# Patient Record
Sex: Female | Born: 1975 | Race: Black or African American | Hispanic: No | Marital: Married | Smoking: Never smoker
Health system: Southern US, Community
[De-identification: ages and names within clinical notes are randomized; demographics above are authoritative.]

## PROBLEM LIST (undated history)

## (undated) DIAGNOSIS — D649 Anemia, unspecified: Secondary | ICD-10-CM

## (undated) DIAGNOSIS — F419 Anxiety disorder, unspecified: Secondary | ICD-10-CM

## (undated) DIAGNOSIS — R079 Chest pain, unspecified: Secondary | ICD-10-CM

## (undated) HISTORY — PX: CHOLECYSTECTOMY: SHX55

## (undated) HISTORY — PX: DILATION AND CURETTAGE OF UTERUS: SHX78

---

## 2014-08-12 DIAGNOSIS — E049 Nontoxic goiter, unspecified: Secondary | ICD-10-CM | POA: Diagnosis present

## 2020-02-01 ENCOUNTER — Encounter: Payer: Self-pay | Admitting: Emergency Medicine

## 2020-02-01 ENCOUNTER — Emergency Department: Payer: 59

## 2020-02-01 ENCOUNTER — Emergency Department
Admission: EM | Admit: 2020-02-01 | Discharge: 2020-02-01 | Disposition: A | Discharge status: Home / Self Care | Payer: 59 | Attending: Emergency Medicine | Admitting: Emergency Medicine

## 2020-02-01 ENCOUNTER — Other Ambulatory Visit: Payer: Self-pay

## 2020-02-01 DIAGNOSIS — R110 Nausea: Secondary | ICD-10-CM | POA: Insufficient documentation

## 2020-02-01 DIAGNOSIS — R002 Palpitations: Secondary | ICD-10-CM | POA: Diagnosis not present

## 2020-02-01 DIAGNOSIS — R079 Chest pain, unspecified: Secondary | ICD-10-CM | POA: Diagnosis present

## 2020-02-01 DIAGNOSIS — R42 Dizziness and giddiness: Secondary | ICD-10-CM | POA: Diagnosis not present

## 2020-02-01 HISTORY — DX: Anxiety disorder, unspecified: F41.9

## 2020-02-01 HISTORY — DX: Anemia, unspecified: D64.9

## 2020-02-01 HISTORY — DX: Chest pain, unspecified: R07.9

## 2020-02-01 LAB — CBC
HCT: 28.6 % — ABNORMAL LOW (ref 36.0–46.0)
Hemoglobin: 8.7 g/dL — ABNORMAL LOW (ref 12.0–15.0)
MCH: 21.8 pg — ABNORMAL LOW (ref 26.0–34.0)
MCHC: 30.4 g/dL (ref 30.0–36.0)
MCV: 71.5 fL — ABNORMAL LOW (ref 80.0–100.0)
Platelets: 288 10*3/uL (ref 150–400)
RBC: 4 MIL/uL (ref 3.87–5.11)
RDW: 20 % — ABNORMAL HIGH (ref 11.5–15.5)
WBC: 4.5 10*3/uL (ref 4.0–10.5)
nRBC: 0 % (ref 0.0–0.2)

## 2020-02-01 LAB — BASIC METABOLIC PANEL
Anion gap: 8 (ref 5–15)
BUN: 9 mg/dL (ref 6–20)
CO2: 23 mmol/L (ref 22–32)
Calcium: 8.4 mg/dL — ABNORMAL LOW (ref 8.9–10.3)
Chloride: 108 mmol/L (ref 98–111)
Creatinine, Ser: 0.78 mg/dL (ref 0.44–1.00)
GFR, Estimated: >60 mL/min (ref >60)
Glucose, Bld: 110 mg/dL — ABNORMAL HIGH (ref 70–99)
Potassium: 3.6 mmol/L (ref 3.5–5.1)
Sodium: 139 mmol/L (ref 135–145)

## 2020-02-01 LAB — TROPONIN I (HIGH SENSITIVITY)
Troponin I (High Sensitivity): 3 ng/L (ref <18)
Troponin I (High Sensitivity): 5 ng/L (ref <18)

## 2020-02-01 LAB — TSH: TSH: 1.672 u[IU]/mL (ref 0.350–4.500)

## 2020-02-01 LAB — POC URINE PREG, ED: Preg Test, Ur: NEGATIVE

## 2020-02-01 IMAGING — CR DG CHEST 2V
2 series · 2 of 2 positions shown · non-contrast
Comparison: None.

CLINICAL DATA: Chest pain

EXAM:
CHEST - 2 VIEW

[chest pa]
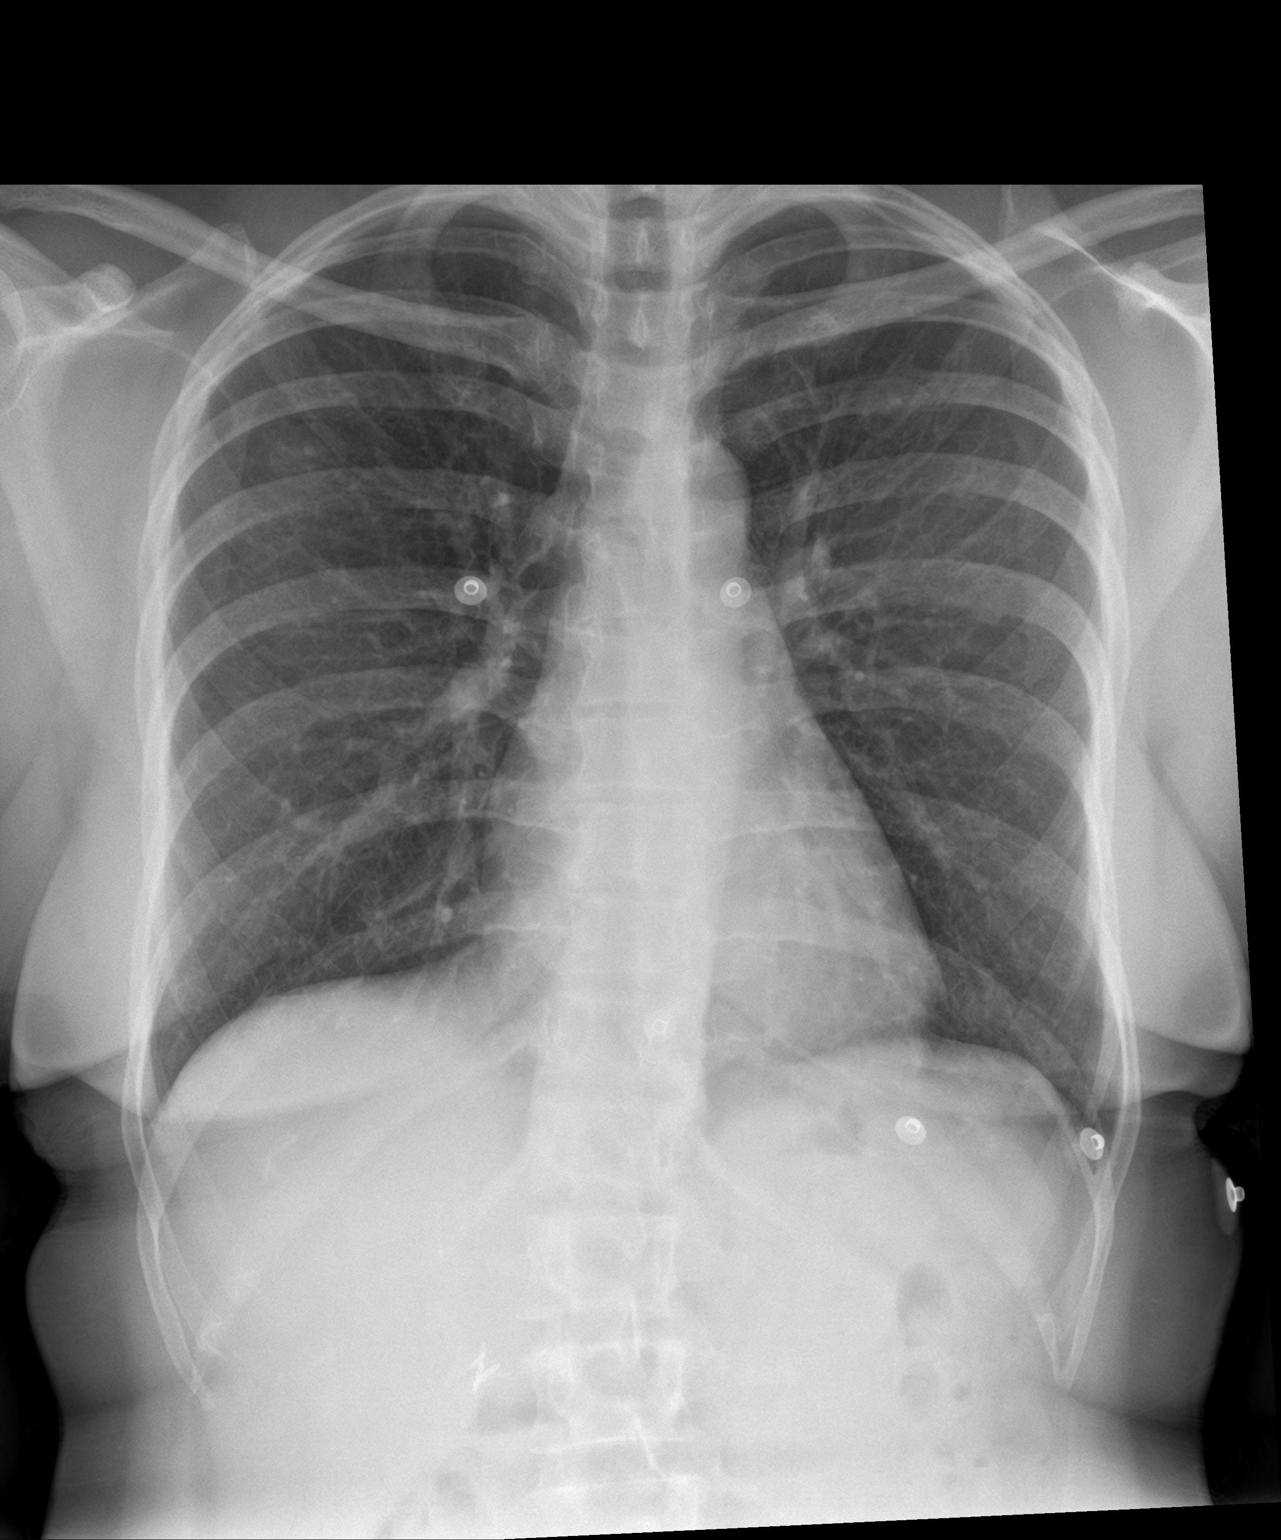

[chest lat]
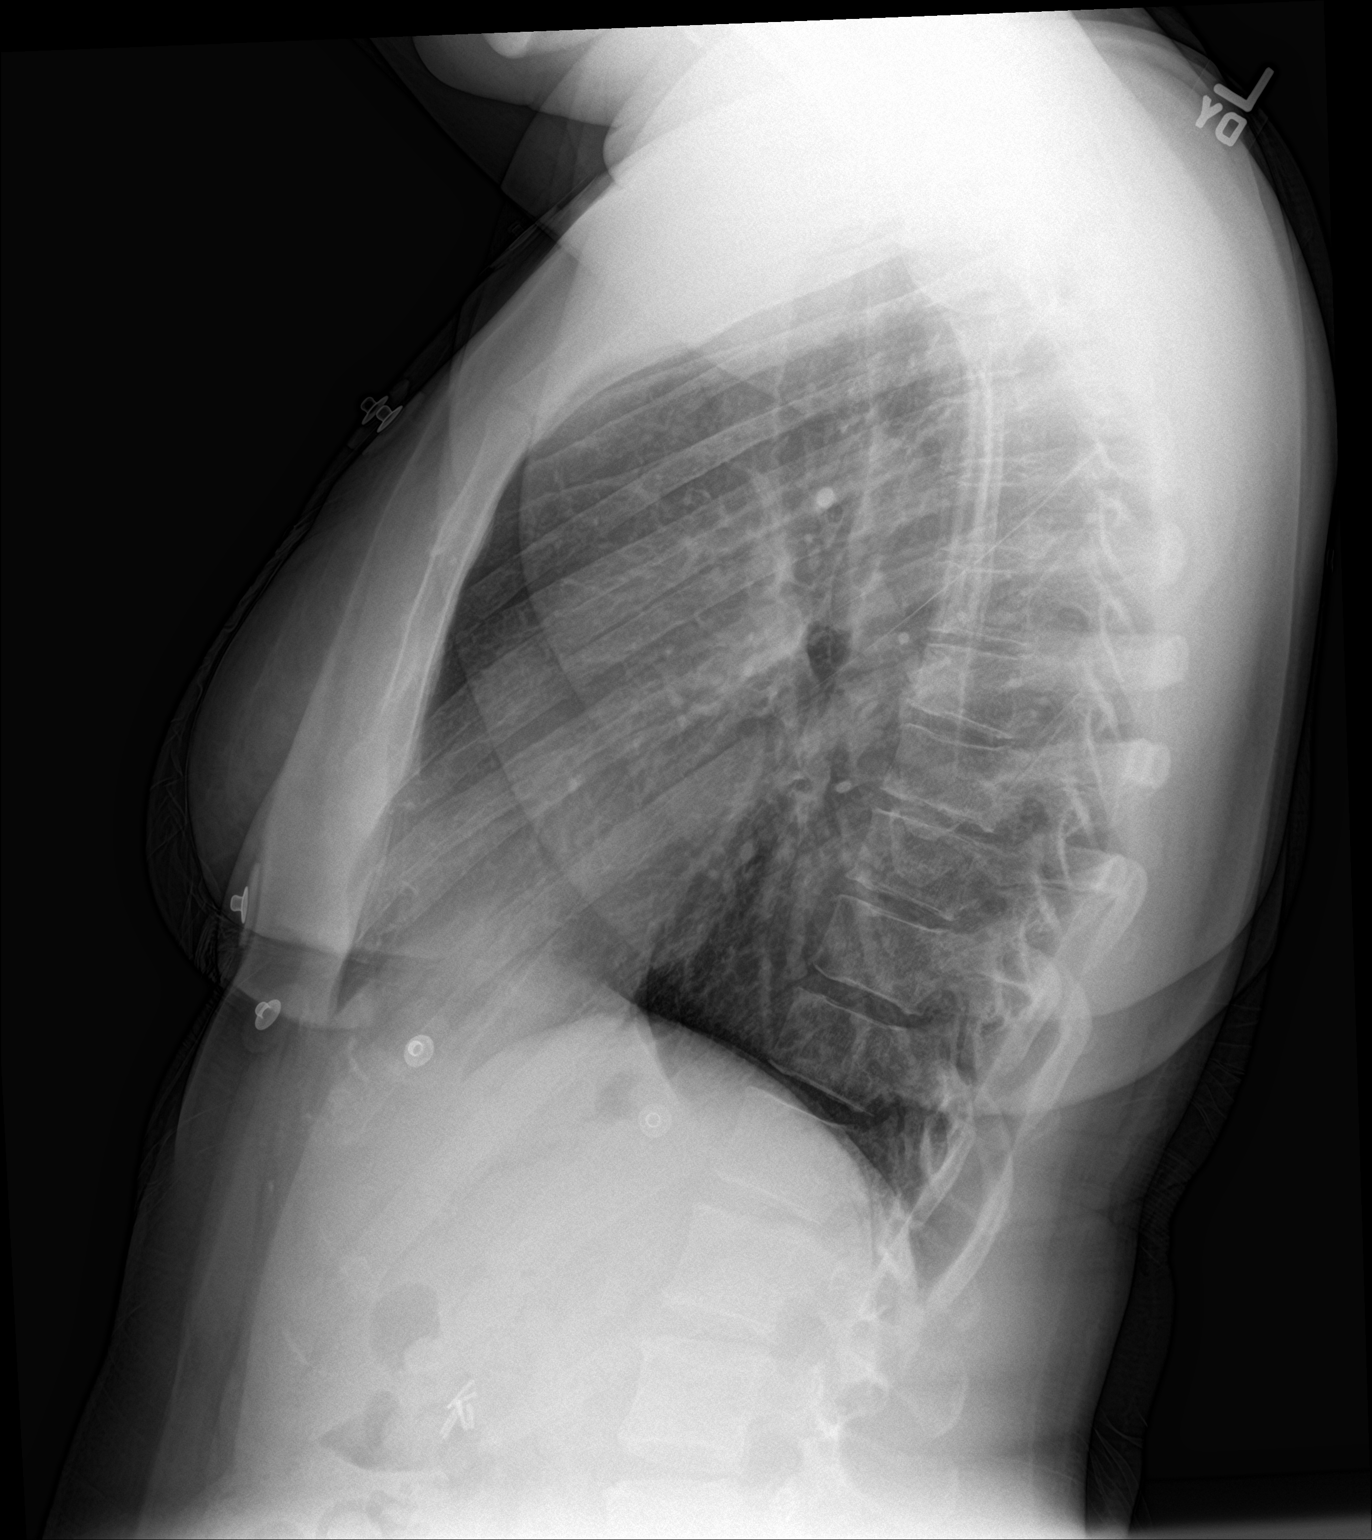

[2 of 2 positions shown; findings below may reference images not displayed]

FINDINGS: The cardiomediastinal silhouette is normal in contour. No pleural
effusion. No pneumothorax. No acute pleuroparenchymal abnormality.
Status post cholecystectomy. No acute osseous abnormality noted.
IMPRESSION: No acute cardiopulmonary abnormality.

## 2020-02-01 NOTE — ED Triage Notes (Signed)
Pt presents to ED via [facility] from home with c/o mid CP that is dull and radiates to back and L axilla x approx 1 hr PTA. Per EMS pt also c/o dizziness and nausea as well. EMS reports prior to their arrival pt took 324 ASA, 4mg  Zofran given en route, 2 SL nitro sprays given en route. EMS reports per patient HR 160 prior to arrival, per EMS HR initially 120, now 90, NSR on the monitor.

## 2020-02-01 NOTE — ED Notes (Signed)
Pt taken to Xray at this time.

## 2020-02-01 NOTE — ED Notes (Signed)
Pt returned from Xray at this time  

## 2020-02-01 NOTE — Discharge Instructions (Signed)
Please do not drink more than 1 or 2 cups of caffeinated coffee a day.  I think your rapid heartbeat and palpitations and chest pain came from that.  I would like you to follow-up with the cardiologist.  Dr. Clayborn Bigness is on-call he is very good.  I have given you his contact information.  If you call the office and let the office staff know that you were in the emergency room with rapid heartbeat and chest pain that should be out again she went fairly quickly.  Please return here for any further problems.  Your thyroid function test, TSH was normal.  You are somewhat anemic.  You might want to get some vitamins with iron and take 1 a day.  Is very common for American woman to be anemic.

## 2020-02-01 NOTE — ED Provider Notes (Signed)
[facility] Emergency Department Provider Note   ____________________________________________   First MD Initiated Contact with Patient 02/01/20 1554     (approximate)  I have reviewed the triage vital signs and the nursing notes.   HISTORY  Chief Complaint Chest Pain    HPI Brenda Savage is a 44 y.o. female who was going to the store and developed a rapid heartbeat and some chest pain radiated to the back in the axilla on the left side.  She got dizzy and nauseated.  She laid down for a while heart white rate went down to 140 and then now it is normal.  She reports she is doing the week she does not drink caffeinated coffee but on the weekend she had at least 4 cups of coffee in the last 24 hours possibly more.  She has not had a rapid heartbeat like this or chest pain like this before but she has had a diagnosis of angina in the past.   Patient does not use soft drinks or drink alcohol or use street drugs.      Past Medical History:  Diagnosis Date  . Anemia   . Anxiety   . Chest pain     There are no problems to display for this patient.     Prior to Admission medications   Not on File    Allergies Patient has no known allergies.  No family history on file.  Social History Social History   Tobacco Use  . Smoking status: Never Smoker  . Smokeless tobacco: Never Used  Substance Use Topics  . Alcohol use: Not Currently  . Drug use: Not Currently    Review of Systems Currently: Constitutional: No fever/chills Eyes: No visual changes. ENT: No sore throat. Cardiovascular: Denies chest pain. Respiratory: Denies shortness of breath. Gastrointestinal: No abdominal pain.  No nausea, no vomiting.  No diarrhea.  No constipation. Genitourinary: Negative for dysuria. Musculoskeletal: Negative for back pain. Skin: Negative for rash. Neurological: Negative for headaches, focal weakness    ____________________________________________   PHYSICAL EXAM:  VITAL SIGNS: ED Triage Vitals  Enc Vitals Group     BP 02/01/20 1412 134/85     Pulse Rate 02/01/20 1412 90     Resp 02/01/20 1412 14     Temp 02/01/20 1412 98.5 F (36.9 C)     Temp Source 02/01/20 1412 Oral     SpO2 02/01/20 1412 100 %     Weight 02/01/20 1413 166 lb (75.3 kg)     Height 02/01/20 1413 5\' 5"  (1.651 m)     Head Circumference --      Peak Flow --      Pain Score 02/01/20 1412 5     Pain Loc --      Pain Edu? --      Excl. in [place]? --     Constitutional: Alert and oriented. Well appearing and in no acute distress. Eyes: Conjunctivae are normal. PER Head: Atraumatic. Nose: No congestion/rhinnorhea. Mouth/Throat: Mucous membranes are moist.  Oropharynx non-erythematous. Neck: No stridor.   Cardiovascular: Normal rate, regular rhythm. Grossly normal heart sounds.  Good peripheral circulation. Respiratory: Normal respiratory effort.  No retractions. Lungs CTAB. Gastrointestinal: Soft and nontender. No distention. No abdominal bruits. No CVA tenderness. Musculoskeletal: No lower extremity tenderness nor edema.   Neurologic:  Normal speech and language. No gross focal neurologic deficits are appreciated. ty. Skin:  Skin is warm, dry and intact. No rash noted.   ____________________________________________  LABS (all labs ordered are listed, but only abnormal results are displayed)  Labs Reviewed  BASIC METABOLIC PANEL - Abnormal; Notable for the following components:      Result Value   Glucose, Bld 110 (*)    Calcium 8.4 (*)    All other components within normal limits  CBC - Abnormal; Notable for the following components:   Hemoglobin 8.7 (*)    HCT 28.6 (*)    MCV 71.5 (*)    MCH 21.8 (*)    RDW 20.0 (*)    All other components within normal limits  TSH  POC URINE PREG, ED  TROPONIN I (HIGH SENSITIVITY)  TROPONIN I (HIGH SENSITIVITY)    ____________________________________________  EKG  EKG read interpreted by me shows rate of 93 normal or slight right axis no acute ST-T wave changes but there are some nonspecific changes. ____________________________________________  RADIOLOGY Gertha Calkin, personally viewed and evaluated these images (plain radiographs) as part of my medical decision making, as well as reviewing the written report by the radiologist.  ED MD interpretation: Chest x-ray read interpreted by me me I have not seen the radiologist reading it shows no acute changes  Official radiology report(s): DG Chest 2 View  Result Date: 02/01/2020 CLINICAL DATA:  Chest pain EXAM: CHEST - 2 VIEW COMPARISON:  None. FINDINGS: The cardiomediastinal silhouette is normal in contour. No pleural effusion. No pneumothorax. No acute pleuroparenchymal abnormality. Status post cholecystectomy. No acute osseous abnormality noted. IMPRESSION: No acute cardiopulmonary abnormality. Electronically Signed   By: Valentino Saxon MD   On: 02/01/2020 15:16    ____________________________________________   PROCEDURES  Procedure(s) performed (including Critical Care):  Procedures   ____________________________________________   INITIAL IMPRESSION / ASSESSMENT AND PLAN / ED COURSE  Patient is somewhat anemic.  She has drank a lot of coffee today.  This is the likely reason for her tachycardia.  I will check another troponin and a TSH refer her to cardiology.  She had no pleuritic chest pain she is not short of breath now has no chest pain at all now              ____________________________________________   FINAL CLINICAL IMPRESSION(S) / ED DIAGNOSES  Final diagnoses:  Palpitations     ED Discharge Orders    None      *Please note:  Brenda Savage was evaluated in Emergency Department on 02/01/2020 for the symptoms described in the history of present illness. She was evaluated in the context of the  global COVID-19 pandemic, which necessitated consideration that the patient might be at risk for infection with the SARS-CoV-2 virus that causes COVID-19. Institutional protocols and algorithms that pertain to the evaluation of patients at risk for COVID-19 are in a state of rapid change based on information released by regulatory bodies including the CDC and federal and state organizations. These policies and algorithms were followed during the patient's care in the ED.  Some ED evaluations and interventions may be delayed as a result of limited staffing during and the pandemic.*   Note:  This document was prepared using Dragon voice recognition software and may include unintentional dictation errors.    Nena Polio, MD 02/01/20 2350

## 2021-01-18 ENCOUNTER — Other Ambulatory Visit: Payer: Self-pay

## 2021-01-18 ENCOUNTER — Ambulatory Visit
Admission: EM | Admit: 2021-01-18 | Discharge: 2021-01-18 | Disposition: A | Discharge status: Home / Self Care | Payer: Managed Care, Other (non HMO) | Attending: Emergency Medicine | Admitting: Emergency Medicine

## 2021-01-18 DIAGNOSIS — J111 Influenza due to unidentified influenza virus with other respiratory manifestations: Secondary | ICD-10-CM | POA: Insufficient documentation

## 2021-01-18 LAB — RAPID INFLUENZA A&B ANTIGENS
Influenza A (ARMC): NEGATIVE
Influenza B (ARMC): NEGATIVE

## 2021-01-18 MED ORDER — IPRATROPIUM BROMIDE 0.06 % NA SOLN: 2.0000 | Freq: Four times a day (QID) | NASAL | 12 refills | Status: DC

## 2021-01-18 MED ORDER — BENZONATATE 100 MG PO CAPS: 200.0000 mg | ORAL_CAPSULE | Freq: Three times a day (TID) | ORAL | 0 refills | Status: DC

## 2021-01-18 MED ORDER — PROMETHAZINE-DM 6.25-15 MG/5ML PO SYRP: 5.0000 mL | ORAL_SOLUTION | Freq: Four times a day (QID) | ORAL | 0 refills | Status: DC | PRN

## 2021-01-18 NOTE — ED Provider Notes (Signed)
[facility]    CSN: [id number] Arrival date & time: 01/18/21  0856      History   Chief Complaint Chief Complaint  Patient presents with   Fever   bodyaches   Chills    HPI Brenda Savage is a 45 y.o. female.   HPI  45 year old female here for evaluation of flulike symptoms.  Patient's daughter is currently infected with flu a and was evaluated in the urgent care yesterday.  She was started on Tamiflu but the pharmacy did not have it.  Patient was also given prescription for Tamiflu last night to start as she was developing body aches at the time of her daughter's visit.  She did not start the Tamiflu because again, the pharmacy did not have it.  Patient reports that her symptoms worsened overnight with a T-max fever of 101.8, runny nose and nasal congestion, sore throat, productive cough for clear sputum, and intermittent nausea.  She denies any ear pain or pressure, shortness breath or wheezing, vomiting, or diarrhea.  Patient is also complaining of headache, chills, and body aches.  Past Medical History:  Diagnosis Date   Anemia    Anxiety    Chest pain     There are no problems to display for this patient.   Past Surgical History:  Procedure Laterality Date   CHOLECYSTECTOMY     DILATION AND CURETTAGE OF UTERUS      OB History   No obstetric history on file.      Home Medications    Prior to Admission medications   Medication Sig Start Date End Date Taking? Authorizing Provider  benzonatate (TESSALON) 100 MG capsule Take 2 capsules (200 mg total) by mouth every 8 (eight) hours. 01/18/21  Yes Margarette Canada, NP  ipratropium (ATROVENT) 0.06 % nasal spray Place 2 sprays into both nostrils 4 (four) times daily. 01/18/21  Yes Margarette Canada, NP  Norethindrone Acetate-Ethinyl Estrad-FE (LOESTRIN 24 FE) 1-20 MG-MCG(24) tablet Take 1 tablet by mouth daily. 04/02/20 04/02/21 Yes [provider]  promethazine-dextromethorphan (PROMETHAZINE-DM) 6.25-15  MG/5ML syrup Take 5 mLs by mouth 4 (four) times daily as needed. 01/18/21  Yes Margarette Canada, NP  oseltamivir (TAMIFLU) 75 MG capsule Take 75 mg by mouth 2 (two) times daily. 01/17/21   [provider]    Family History History reviewed. No pertinent family history.  Social History Social History   Tobacco Use   Smoking status: Never   Smokeless tobacco: Never  Substance Use Topics   Alcohol use: Not Currently   Drug use: Not Currently     Allergies   Patient has no known allergies.   Review of Systems Review of Systems  Constitutional:  Positive for chills and fever. Negative for activity change and appetite change.  HENT:  Positive for congestion, rhinorrhea and sore throat. Negative for ear pain.   Respiratory:  Positive for cough. Negative for shortness of breath and wheezing.   Gastrointestinal:  Negative for diarrhea, nausea and vomiting.  Musculoskeletal:  Positive for arthralgias and myalgias.  Skin:  Negative for rash.  Neurological:  Positive for headaches.  Hematological: Negative.   Psychiatric/Behavioral: Negative.      Physical Exam Triage Vital Signs ED Triage Vitals  Enc Vitals Group     BP 01/18/21 0930 (!) 121/94     Pulse Rate 01/18/21 0930 95     Resp 01/18/21 0930 18     Temp 01/18/21 0930 (!) 101.9 F (38.8 C)     Temp  Source 01/18/21 0930 Oral     SpO2 01/18/21 0930 100 %     Weight 01/18/21 0929 173 lb (78.5 kg)     Height 01/18/21 0929 5' 5.5" (1.664 m)     Head Circumference --      Peak Flow --      Pain Score 01/18/21 0929 9     Pain Loc --      Pain Edu? --      Excl. in [place]? --    No data found.  Updated Vital Signs BP (!) 121/94 (BP Location: Left Arm)   Pulse 95   Temp (!) 101.9 F (38.8 C) (Oral)   Resp 18   Ht 5' 5.5" (1.664 m)   Wt 173 lb (78.5 kg)   LMP 01/15/2021   SpO2 100%   BMI 28.35 kg/m   Visual Acuity Right Eye Distance:   Left Eye Distance:   Bilateral Distance:    Right Eye Near:   Left Eye  Near:    Bilateral Near:     Physical Exam Vitals and nursing note reviewed.  Constitutional:      General: She is not in acute distress.    Appearance: Normal appearance. She is ill-appearing.  HENT:     Head: Normocephalic and atraumatic.     Right Ear: Tympanic membrane, ear canal and external ear normal. There is no impacted cerumen.     Left Ear: Tympanic membrane, ear canal and external ear normal. There is no impacted cerumen.     Nose: Congestion and rhinorrhea present.     Mouth/Throat:     Mouth: Mucous membranes are moist.     Pharynx: Oropharynx is clear. Posterior oropharyngeal erythema present.  Cardiovascular:     Rate and Rhythm: Normal rate and regular rhythm.     Pulses: Normal pulses.     Heart sounds: Normal heart sounds. No murmur heard.   No gallop.  Pulmonary:     Effort: Pulmonary effort is normal.     Breath sounds: Normal breath sounds. No wheezing, rhonchi or rales.  Musculoskeletal:     Cervical back: Normal range of motion and neck supple.  Lymphadenopathy:     Cervical: No cervical adenopathy.  Skin:    General: Skin is warm and dry.     Capillary Refill: Capillary refill takes less than 2 seconds.     Findings: No erythema or rash.  Neurological:     General: No focal deficit present.     Mental Status: She is alert and oriented to person, place, and time.  Psychiatric:        Mood and Affect: Mood normal.        Behavior: Behavior normal.        Thought Content: Thought content normal.        Judgment: Judgment normal.     UC Treatments / Results  Labs (all labs ordered are listed, but only abnormal results are displayed) Labs Reviewed  RAPID INFLUENZA A&B ANTIGENS    EKG   Radiology No results found.  Procedures Procedures (including critical care time)  Medications Ordered in UC Medications - No data to display  Initial Impression / Assessment and Plan / UC Course  I have reviewed the triage vital signs and the nursing  notes.  Pertinent labs & imaging results that were available during my care of the patient were reviewed by me and considered in my medical decision making (see chart for details).  Patient is  a pleasant though ill-appearing 45 year old female here for evaluation of flulike symptoms that began last night.  Her daughter is currently diagnosed with influenza A.  Both were prescribed Tamiflu last night but neither has started as the pharmacy was out of the medication and it is due to arrive today.  Patient's physical exam reveals pearly gray tympanic membranes bilaterally with normal light reflex and clear external auditory canals.  Nasal mucosa is erythematous and edematous with clear nasal discharge.  Oropharyngeal exam reveals posterior oropharyngeal erythema with clear postnasal drip.  No tonsillar edema, erythema, or exudate noted.  No cervical lymphadenopathy appreciated on exam.  Cardiopulmonary exam reveals clear lung sounds in all fields.  Taking a deep breath does trigger a cough in the patient however.  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  We will give patient a prescription for Atrovent nasal spray to help with the nasal congestion runny nose, Tessalon Perles and promethazine DM cough syrup to help with cough and congestion.  Work note provided.  Influenza testing here is negative per the lab.  Patient's symptoms are consistent with influenza and with her exposure and her daughter being positive I believe she has influenza and will treat her for such.  I am advising her to continue the Tamiflu, pick it up today is her taking it, and will give Atrovent nasal spray, Tessalon Perles, Promethazine DM cough syrup.   Final Clinical Impressions(s) / UC Diagnoses   Final diagnoses:  Influenza with respiratory manifestation     Discharge Instructions      FeverTake the Tamiflu twice daily for 5 days for treatment of influenza.  Use the Atrovent  nasal spray, 2 squirts up each nostril every 6 hours, as needed for nasal congestion and runny nose.  Use over-the-counter Delsym, Zarbee's, or Robitussin during the day as needed for cough.  Use the Tessalon Perles every 8 hours as needed for cough.  Taken with a small sip of water.  You may experience some numbness to your tongue or metallic taste in her mouth, this is normal.  Use the Promethazine DM cough syrup at bedtime as will make you drowsy but it should help dry up your postnasal drip and aid you in sleep and cough relief.  Use over-the-counter Tylenol and ibuprofen according to the package instructions as needed for headache, and body aches.  Return for reevaluation, or see your primary care provider, for new or worsening symptoms.      ED Prescriptions     Medication Sig Dispense Auth. Provider   benzonatate (TESSALON) 100 MG capsule Take 2 capsules (200 mg total) by mouth every 8 (eight) hours. 21 capsule Margarette Canada, NP   ipratropium (ATROVENT) 0.06 % nasal spray Place 2 sprays into both nostrils 4 (four) times daily. 15 mL Margarette Canada, NP   promethazine-dextromethorphan (PROMETHAZINE-DM) 6.25-15 MG/5ML syrup Take 5 mLs by mouth 4 (four) times daily as needed. 118 mL Margarette Canada, NP      PDMP not reviewed this encounter.   Margarette Canada, NP 01/18/21 1010

## 2021-01-18 NOTE — Discharge Instructions (Addendum)
FeverTake the Tamiflu twice daily for 5 days for treatment of influenza.  Use the Atrovent nasal spray, 2 squirts up each nostril every 6 hours, as needed for nasal congestion and runny nose.  Use over-the-counter Delsym, Zarbee's, or Robitussin during the day as needed for cough.  Use the Tessalon Perles every 8 hours as needed for cough.  Taken with a small sip of water.  You may experience some numbness to your tongue or metallic taste in her mouth, this is normal.  Use the Promethazine DM cough syrup at bedtime as will make you drowsy but it should help dry up your postnasal drip and aid you in sleep and cough relief.  Use over-the-counter Tylenol and ibuprofen according to the package instructions as needed for headache, and body aches.  Return for reevaluation, or see your primary care provider, for new or worsening symptoms.

## 2021-01-18 NOTE — ED Triage Notes (Addendum)
Daughter diagnosed with flu yesterday. Pt now exhibiting same symptoms of cough, mild headache, fever, body aches and chills. She got a prescription for Tamiflu last night because her daughter had it

## 2021-02-13 ENCOUNTER — Emergency Department: Payer: 59

## 2021-02-13 ENCOUNTER — Inpatient Hospital Stay
Admission: EM | Admit: 2021-02-13 | Discharge: 2021-02-15 | DRG: 948 | Disposition: A | Discharge status: Home / Self Care | Payer: 59 | Attending: Internal Medicine | Admitting: Internal Medicine

## 2021-02-13 ENCOUNTER — Other Ambulatory Visit: Payer: Self-pay

## 2021-02-13 ENCOUNTER — Encounter: Payer: Self-pay | Admitting: Internal Medicine

## 2021-02-13 DIAGNOSIS — D649 Anemia, unspecified: Secondary | ICD-10-CM | POA: Diagnosis present

## 2021-02-13 DIAGNOSIS — R000 Tachycardia, unspecified: Secondary | ICD-10-CM | POA: Diagnosis present

## 2021-02-13 DIAGNOSIS — Z20822 Contact with and (suspected) exposure to covid-19: Secondary | ICD-10-CM | POA: Diagnosis present

## 2021-02-13 DIAGNOSIS — H539 Unspecified visual disturbance: Secondary | ICD-10-CM

## 2021-02-13 DIAGNOSIS — E049 Nontoxic goiter, unspecified: Secondary | ICD-10-CM | POA: Diagnosis present

## 2021-02-13 DIAGNOSIS — D509 Iron deficiency anemia, unspecified: Secondary | ICD-10-CM | POA: Diagnosis present

## 2021-02-13 DIAGNOSIS — R200 Anesthesia of skin: Secondary | ICD-10-CM | POA: Diagnosis not present

## 2021-02-13 DIAGNOSIS — R202 Paresthesia of skin: Secondary | ICD-10-CM | POA: Diagnosis not present

## 2021-02-13 DIAGNOSIS — D329 Benign neoplasm of meninges, unspecified: Secondary | ICD-10-CM | POA: Diagnosis present

## 2021-02-13 DIAGNOSIS — R2990 Unspecified symptoms and signs involving the nervous system: Secondary | ICD-10-CM | POA: Diagnosis not present

## 2021-02-13 DIAGNOSIS — R739 Hyperglycemia, unspecified: Secondary | ICD-10-CM | POA: Diagnosis present

## 2021-02-13 DIAGNOSIS — F419 Anxiety disorder, unspecified: Secondary | ICD-10-CM | POA: Diagnosis present

## 2021-02-13 DIAGNOSIS — Z793 Long term (current) use of hormonal contraceptives: Secondary | ICD-10-CM

## 2021-02-13 DIAGNOSIS — Z79899 Other long term (current) drug therapy: Secondary | ICD-10-CM

## 2021-02-13 DIAGNOSIS — J069 Acute upper respiratory infection, unspecified: Secondary | ICD-10-CM | POA: Diagnosis present

## 2021-02-13 DIAGNOSIS — G2581 Restless legs syndrome: Secondary | ICD-10-CM | POA: Diagnosis present

## 2021-02-13 DIAGNOSIS — R531 Weakness: Secondary | ICD-10-CM | POA: Diagnosis not present

## 2021-02-13 DIAGNOSIS — R7982 Elevated C-reactive protein (CRP): Secondary | ICD-10-CM | POA: Diagnosis present

## 2021-02-13 DIAGNOSIS — R0602 Shortness of breath: Secondary | ICD-10-CM | POA: Diagnosis present

## 2021-02-13 DIAGNOSIS — R569 Unspecified convulsions: Secondary | ICD-10-CM

## 2021-02-13 LAB — DIFFERENTIAL
Abs Immature Granulocytes: 0.01 10*3/uL (ref 0.00–0.07)
Basophils Absolute: 0 10*3/uL (ref 0.0–0.1)
Basophils Relative: 1 %
Eosinophils Absolute: 0.1 10*3/uL (ref 0.0–0.5)
Eosinophils Relative: 2 %
Immature Granulocytes: 0 %
Lymphocytes Relative: 48 %
Lymphs Abs: 2.3 10*3/uL (ref 0.7–4.0)
Monocytes Absolute: 0.4 10*3/uL (ref 0.1–1.0)
Monocytes Relative: 8 %
Neutro Abs: 1.9 10*3/uL (ref 1.7–7.7)
Neutrophils Relative %: 41 %

## 2021-02-13 LAB — PROTIME-INR
INR: 1 (ref 0.8–1.2)
Prothrombin Time: 12.8 seconds (ref 11.4–15.2)

## 2021-02-13 LAB — APTT: aPTT: 27 seconds (ref 24–36)

## 2021-02-13 LAB — CBC
HCT: 31.3 % — ABNORMAL LOW (ref 36.0–46.0)
Hemoglobin: 9.5 g/dL — ABNORMAL LOW (ref 12.0–15.0)
MCH: 21.6 pg — ABNORMAL LOW (ref 26.0–34.0)
MCHC: 30.4 g/dL (ref 30.0–36.0)
MCV: 71.1 fL — ABNORMAL LOW (ref 80.0–100.0)
Platelets: 313 10*3/uL (ref 150–400)
RBC: 4.4 MIL/uL (ref 3.87–5.11)
RDW: 19.4 % — ABNORMAL HIGH (ref 11.5–15.5)
WBC: 4.7 10*3/uL (ref 4.0–10.5)
nRBC: 0 % (ref 0.0–0.2)

## 2021-02-13 LAB — COMPREHENSIVE METABOLIC PANEL
ALT: 17 U/L (ref 0–44)
AST: 20 U/L (ref 15–41)
Albumin: 3.7 g/dL (ref 3.5–5.0)
Alkaline Phosphatase: 54 U/L (ref 38–126)
Anion gap: 6 (ref 5–15)
BUN: 11 mg/dL (ref 6–20)
CO2: 24 mmol/L (ref 22–32)
Calcium: 9.1 mg/dL (ref 8.9–10.3)
Chloride: 106 mmol/L (ref 98–111)
Creatinine, Ser: 0.76 mg/dL (ref 0.44–1.00)
GFR, Estimated: >60 mL/min (ref >60)
Glucose, Bld: 103 mg/dL — ABNORMAL HIGH (ref 70–99)
Potassium: 3.8 mmol/L (ref 3.5–5.1)
Sodium: 136 mmol/L (ref 135–145)
Total Bilirubin: 0.6 mg/dL (ref 0.3–1.2)
Total Protein: 7.4 g/dL (ref 6.5–8.1)

## 2021-02-13 LAB — POC URINE PREG, ED: Preg Test, Ur: NEGATIVE

## 2021-02-13 LAB — CBG MONITORING, ED: Glucose-Capillary: 91 mg/dL (ref 70–99)

## 2021-02-13 IMAGING — MR MR HEAD WO/W CM
13 series · 48 of 48 positions shown · IV contrast (gadavist)
Comparison: Noncontrast head CT and CT angiogram head/neck
[DATE].

CLINICAL DATA: Left facial weakness, numbness and vision loss.
Left-sided weakness and dysmetria.

EXAM:
MRI HEAD WITHOUT AND WITH CONTRAST
TECHNIQUE: Multiplanar, multiecho pulse sequences of the brain and surrounding
structures were obtained without and with intravenous contrast.
CONTRAST:  7.5mL GADAVIST GADOBUTROL 1 MMOL/ML IV SOLN

[Series 20: ax dwi_tracew · axial · 3.0mm · 0.71mm/px · z∈[+8,+155]mm · 4 of 50 slices shown]
[im 1/50]
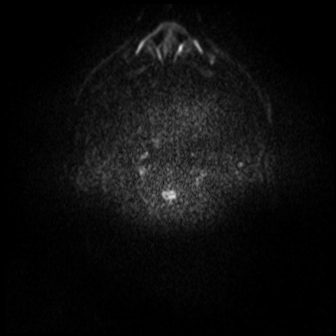
[im 17/50]
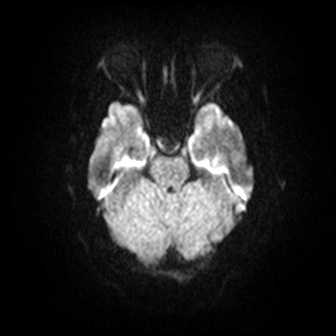
[im 33/50]
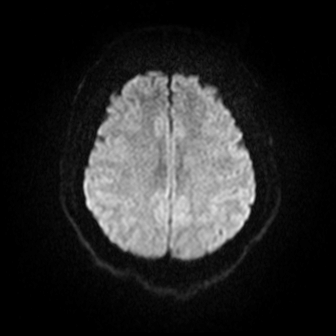
[im 50/50]
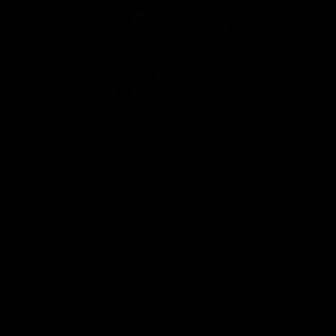

[Series 21: ax dwi_adc · axial · 3.0mm · 0.71mm/px · z∈[+8,+152]mm · 4 of 49 slices shown]
[im 1/49]
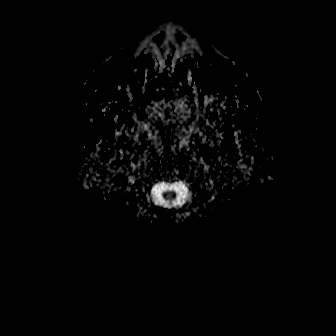
[im 17/49]
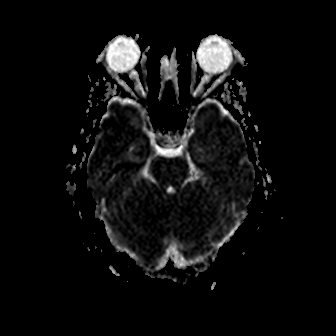
[im 33/49]
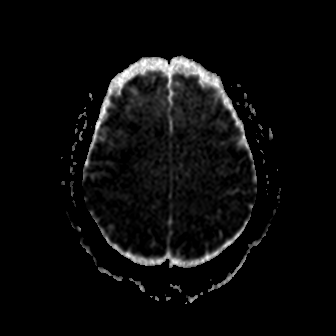
[im 49/49]
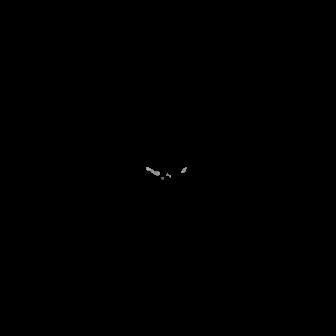

[Series 22: cor dwi_tracew · coronal · 5.0mm · 0.68mm/px · 2 of 38 slices shown]
[im 1/38]
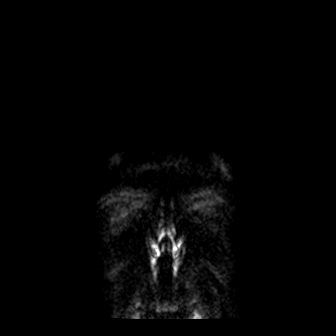
[im 38/38]
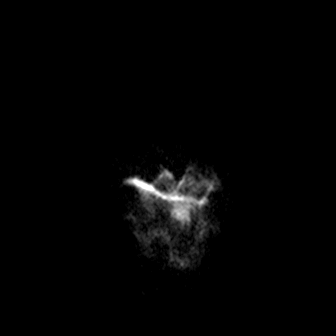

[Series 23: cor dwi_adc · coronal · 5.0mm · 0.68mm/px · 2 of 38 slices shown]
[im 1/38]
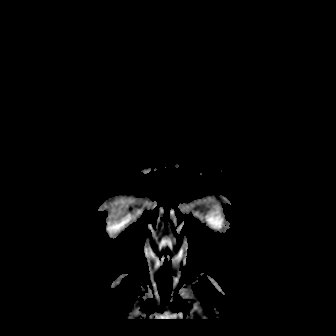
[im 38/38]
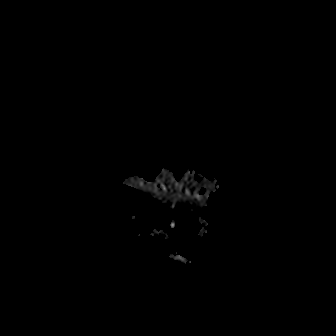

[Series 24: T1 · sagittal · 5.0mm · 0.47mm/px · 1 of 22 slices shown (1 of 2)]
[im 1/22]
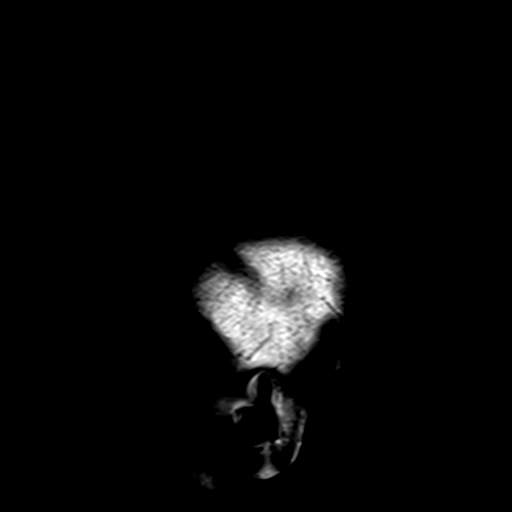

[Series 25: T2 · axial · 5.0mm · 0.86mm/px · z∈[+9,+153]mm · 2 of 25 slices shown (1 of 2)]
[im 1/25]
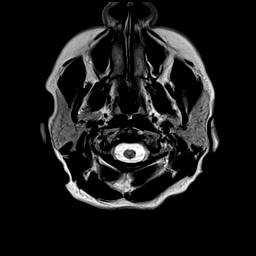
[im 25/25]
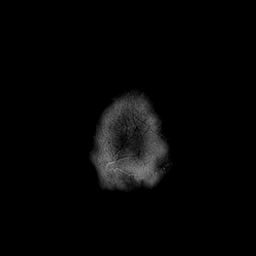

[Series 27: pha_images · axial · 3.0mm · 0.90mm/px · z∈[+5,+158]mm · 3 of 52 slices shown]
[im 1/52]
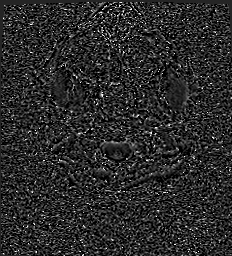
[im 26/52]
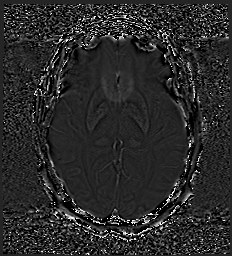
[im 52/52]
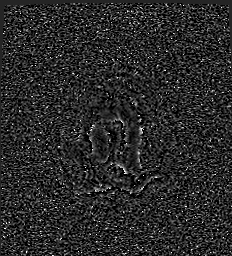

[Series 28: swi_images · axial · 3.0mm · 0.90mm/px · z∈[+5,+158]mm · 3 of 52 slices shown]
[im 1/52]
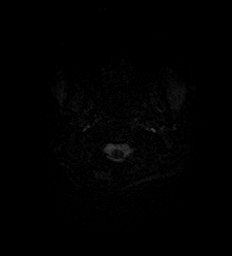
[im 26/52]
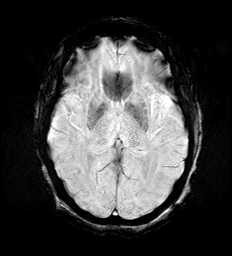
[im 52/52]
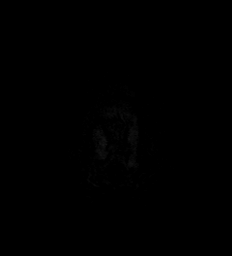

[Series 30: FLAIR · axial · 3.0mm · 0.69mm/px · z∈[+8,+155]mm · 3 of 50 slices shown]
[im 1/50]
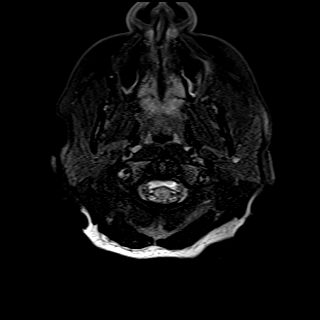
[im 25/50]
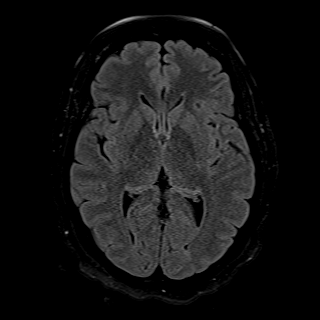
[im 50/50]
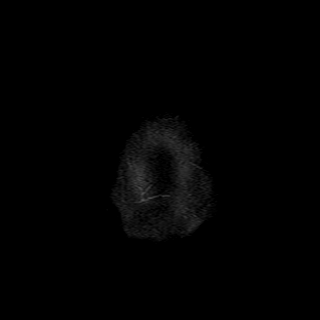

[Series 31: T1 · axial · 1.0mm · 0.98mm/px · z∈[+1,+160]mm · 10 of 160 slices shown (2 of 2)]
[im 1/160]
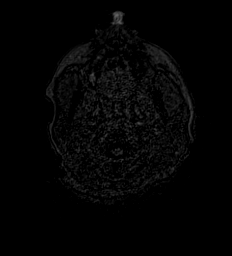
[im 18/160]
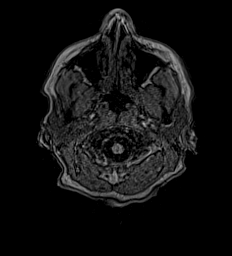
[im 36/160]
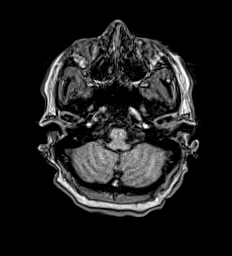
[im 54/160]
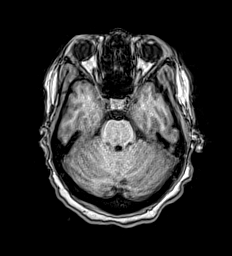
[im 71/160]
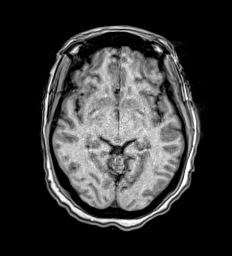
[im 89/160]
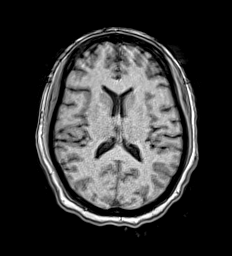
[im 107/160]
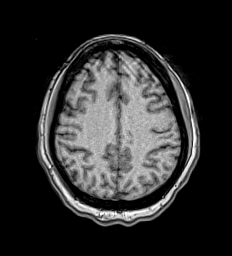
[im 124/160]
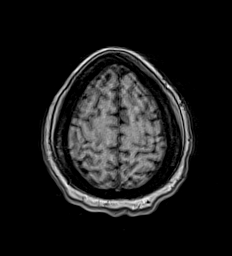
[im 142/160]
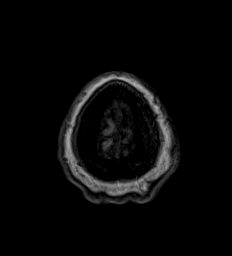
[im 160/160]
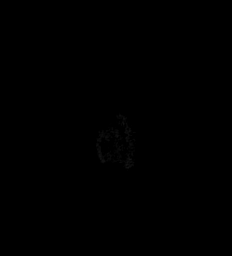

[Series 32: T2 · coronal · 5.0mm · 0.86mm/px · 2 of 30 slices shown (2 of 2)]
[im 1/30]
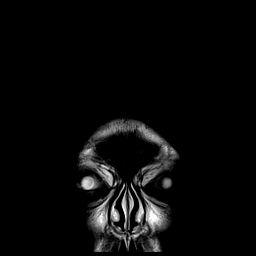
[im 30/30]
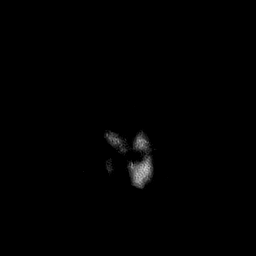

[Series 33: T1 post-contrast · axial · 1.0mm · 0.98mm/px · z∈[+1,+160]mm · 10 of 159 slices shown (1 of 2)]
[im 1/159]
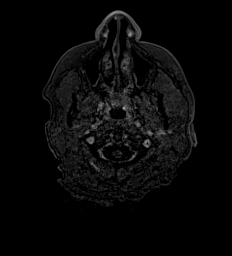
[im 18/159]
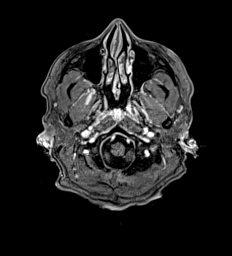
[im 36/159]
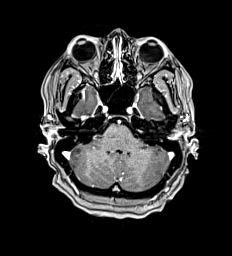
[im 53/159]
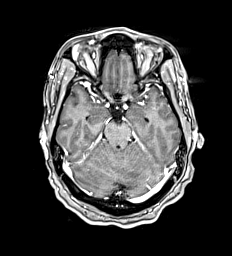
[im 71/159]
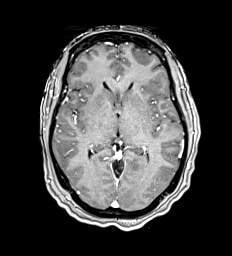
[im 88/159]
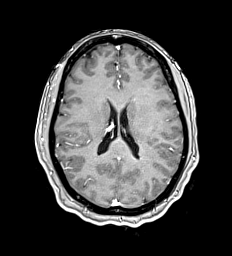
[im 106/159]
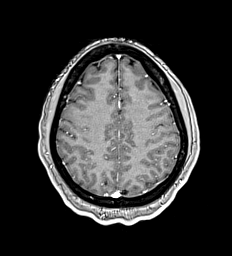
[im 123/159]
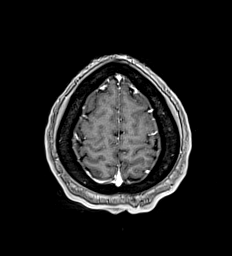
[im 141/159]
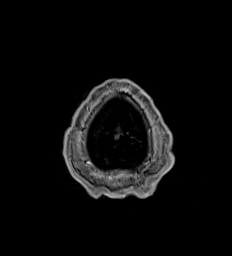
[im 159/159]
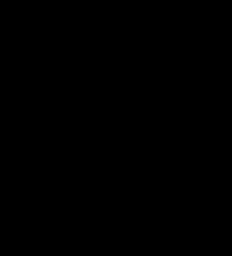

[Series 34: T1 post-contrast · coronal · 5.0mm · 0.43mm/px · 2 of 30 slices shown (2 of 2)]
[im 1/30]
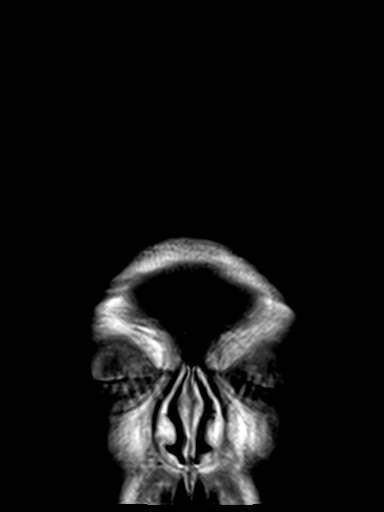
[im 30/30]
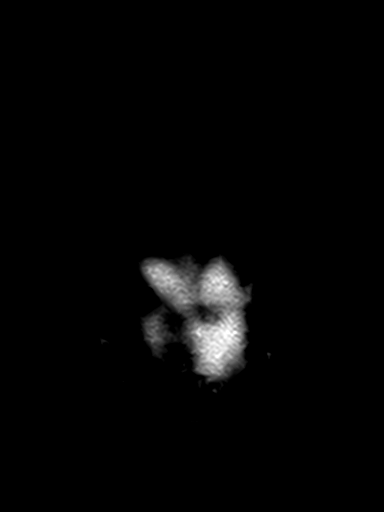

[48 of 48 positions shown; findings below may reference images not displayed]

FINDINGS: Brain:

Cerebral volume is normal.

7 x 4 mm dural-based homogeneously enhancing mass overlying the
right parietal lobe, likely reflecting a small meningioma. No
underlying parenchymal edema.

No cortical encephalomalacia is identified. No significant cerebral
white matter disease.

There is no acute infarct.

No definite chronic intracranial blood products.

No extra-axial fluid collection.

No midline shift.

Vascular: Maintained flow voids within the proximal large arterial
vessels.

Skull and upper cervical spine: Focal suspicious marrow lesion.

Sinuses/Orbits: Visualized orbits show no acute finding. Mild
mucosal thickening within the bilateral maxillary sinuses. Trace
mucosal thickening within the bilateral ethmoid sinuses.
IMPRESSION: No evidence of acute infarct.

7 x 4 mm dural-based enhancing mass overlying the right parietal
lobe, likely reflecting a small meningioma. No underlying
parenchymal edema.

Otherwise unremarkable MRI appearance of the brain.

Mild bilateral ethmoid and maxillary sinus disease, as described.

## 2021-02-13 IMAGING — CT CT ANGIO HEAD-NECK (W OR W/O PERF)
3 of 8 series · 8 of 35 positions shown · IV contrast (APPLIED)
Comparison: Noncontrast head CT from earlier today.

CLINICAL DATA: Acute stroke suspected

EXAM:
CT ANGIOGRAPHY HEAD AND NECK
TECHNIQUE: Multidetector CT imaging of the head and neck was performed using
the standard protocol during bolus administration of intravenous
contrast. Multiplanar CT image reconstructions and MIPs were
obtained to evaluate the vascular anatomy. Carotid stenosis
measurements (when applicable) are obtained utilizing NASCET
criteria, using the distal internal carotid diameter as the
denominator.
CONTRAST:  100mL OMNIPAQUE IOHEXOL 350 MG/ML SOLN

[Series 6: ax thin · axial · 0.58mm/px · z∈[+86,+251]mm · 3 of 332 slices shown]
[im 83/332  soft-tissue]
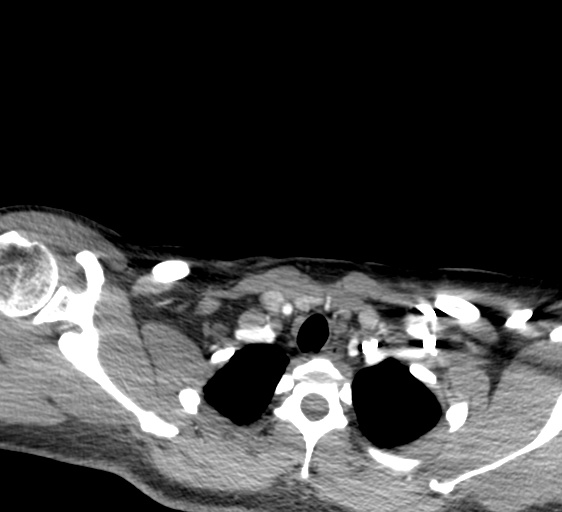
[im 166/332  bone]
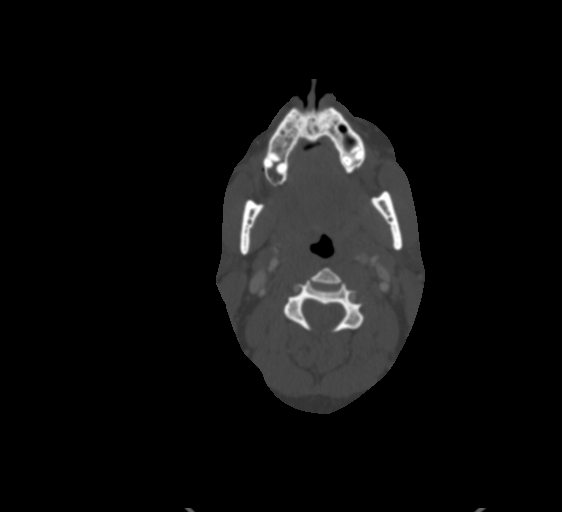
[im 249/332  soft-tissue]
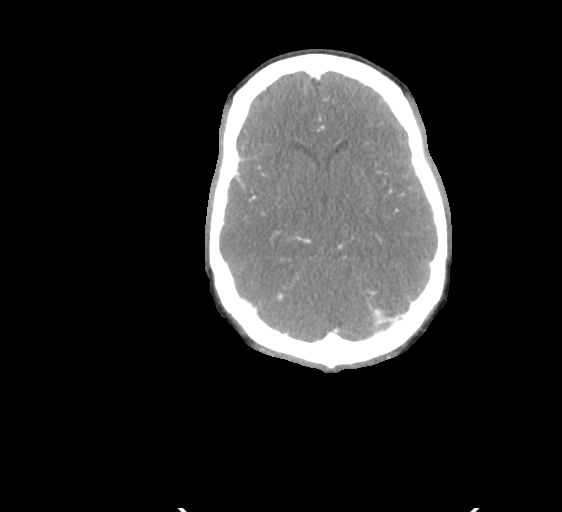

[Series 8: sagittal thin · sagittal · 0.58mm/px · 2 of 324 slices shown]
[im 21/324  soft-tissue]
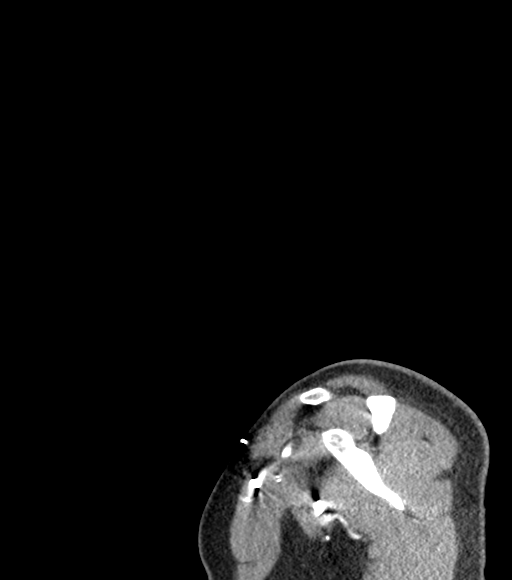
[im 311/324  soft-tissue]
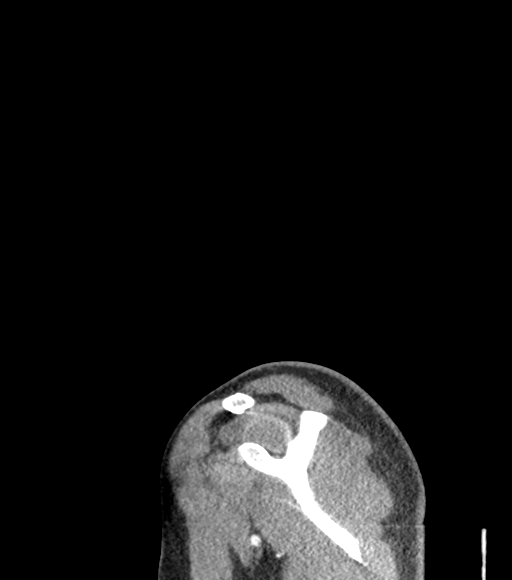

[Series 12: vpct perfusion · axial · 0.39mm/px · z∈[+234,+274]mm · 3 of 320 slices shown]
[im 80/320  soft-tissue]
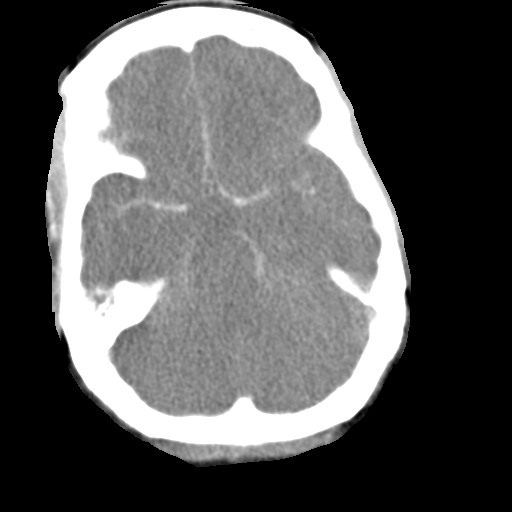
[im 160/320  soft-tissue]
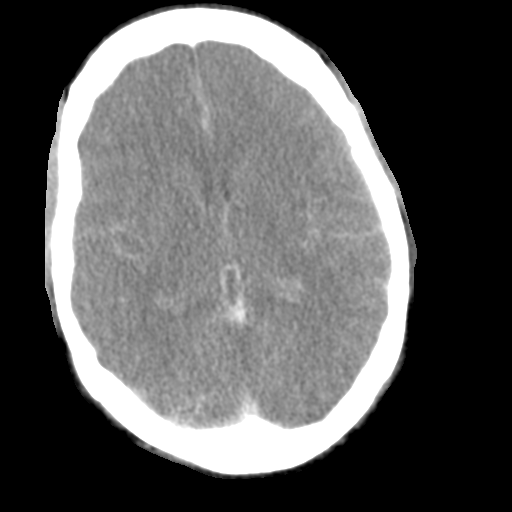
[im 240/320  soft-tissue]
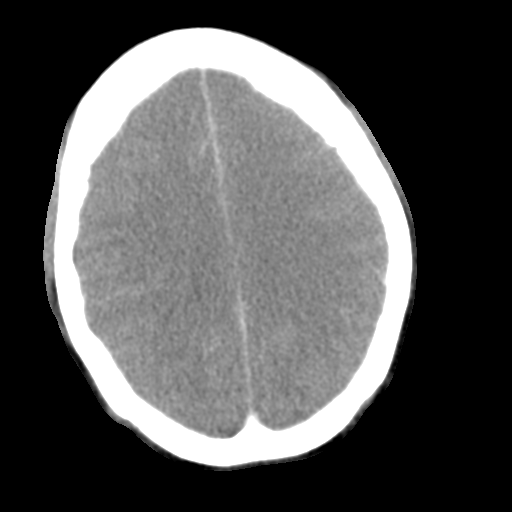

[8 of 35 positions shown; findings below may reference images not displayed]

FINDINGS: CTA NECK FINDINGS

Aortic arch: Unremarkable

Right carotid system: Vessels are smooth and widely patent. The
right ICA is smaller than the left, likely due to circle-of-Willis
variant

Left carotid system: Vessels are smooth and widely patent with no
atheromatous changes

Vertebral arteries: Vessels are smooth and widely patent.

Skeleton: Unremarkable

Other neck: No acute finding

Upper chest: Negative

Review of the MIP images confirms the above findings

CTA HEAD FINDINGS

Anterior circulation: Vessels are smooth and widely patent. Negative
for aneurysm.

Posterior circulation: Vessels are smooth and widely patent.
Negative for aneurysm.

Venous sinuses: Diffusely patent

Anatomic variants: Hypoplastic right A1 segment

Review of the MIP images confirms the above findings
IMPRESSION: No emergent finding or explanation for symptoms.

## 2021-02-13 IMAGING — CT CT HEAD CODE STROKE
4 series · 17 of 47 positions shown, 19 images · non-contrast
Comparison: None.

CLINICAL DATA: Code stroke. Left-sided weakness in the arm and leg
starting at [DATE] a.m.

EXAM:
CT HEAD WITHOUT CONTRAST
TECHNIQUE: Contiguous axial images were obtained from the base of the skull
through the vertex without intravenous contrast.

[Series 3: head wo · axial · 0.43mm/px · z∈[+203,+323]mm · 7 of 33 slices shown, 9 images]
[im 5/33  brain]
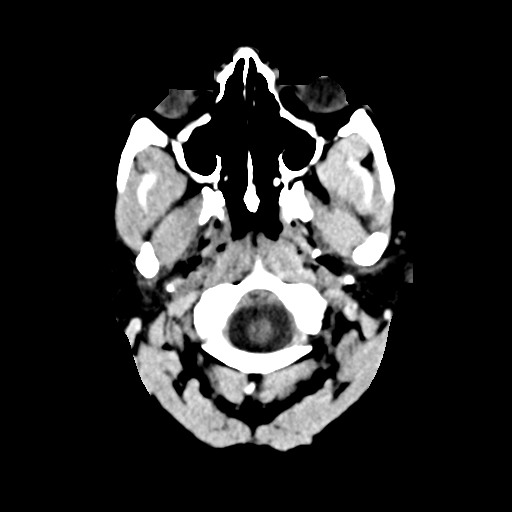
[im 5/33  bone]
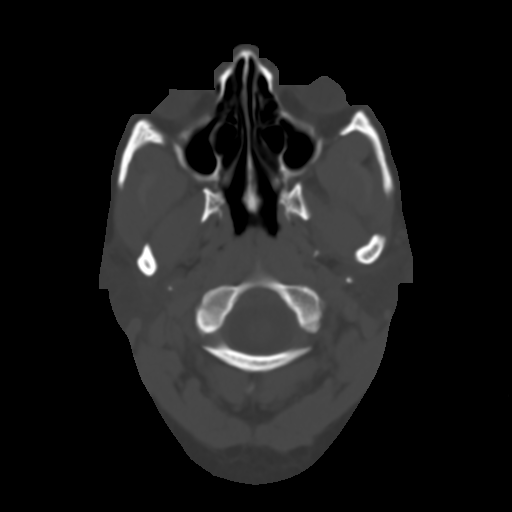
[im 9/33  brain]
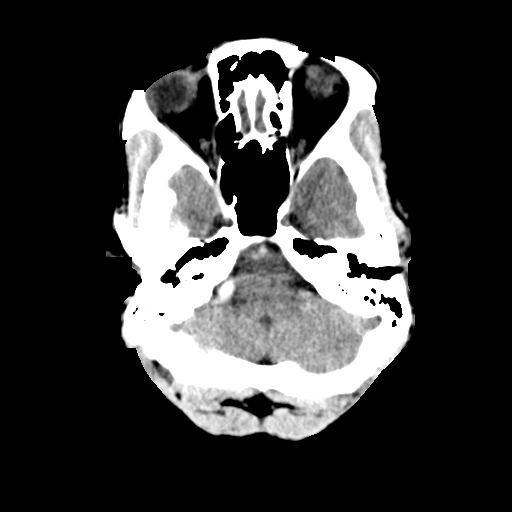
[im 13/33  brain]
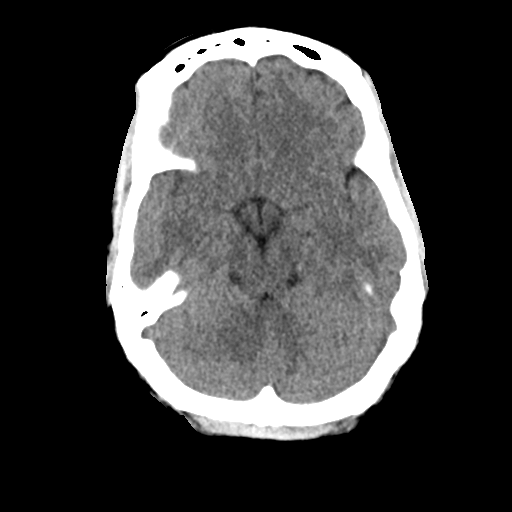
[im 17/33  brain]
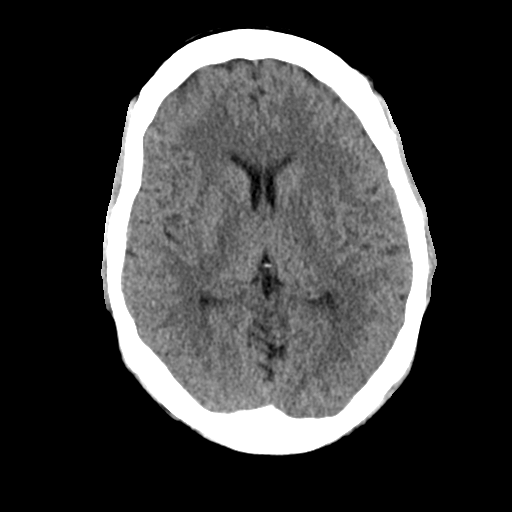
[im 21/33  brain]
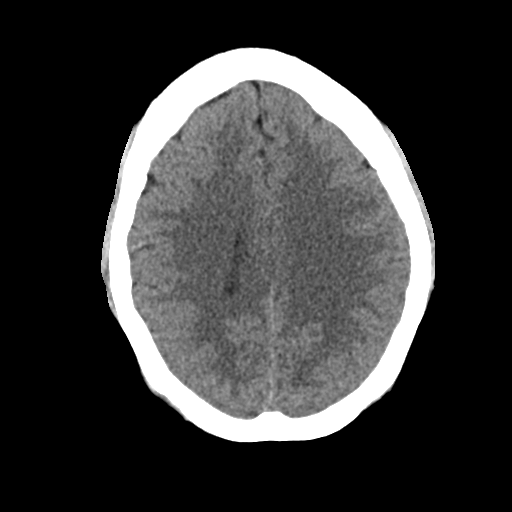
[im 21/33  bone]
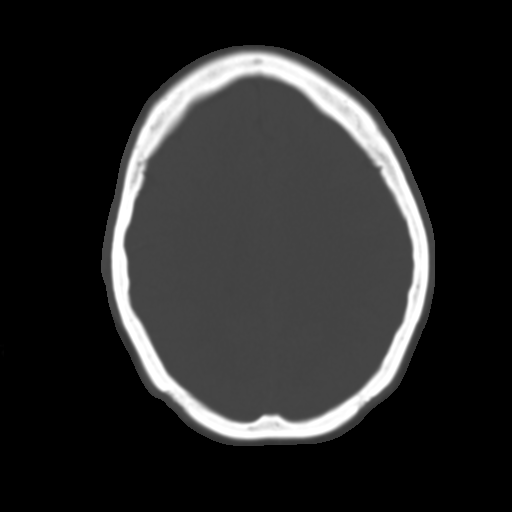
[im 25/33  brain]
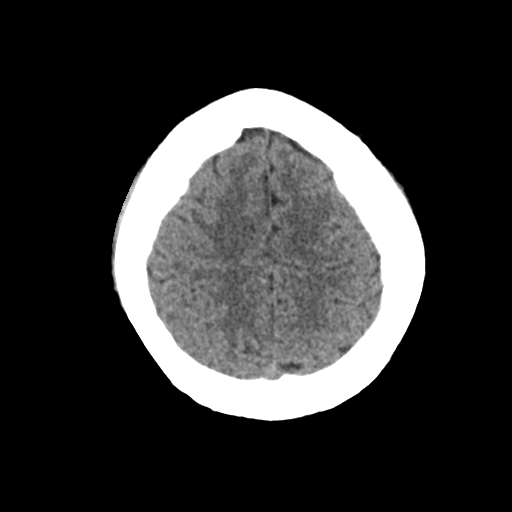
[im 29/33  brain]
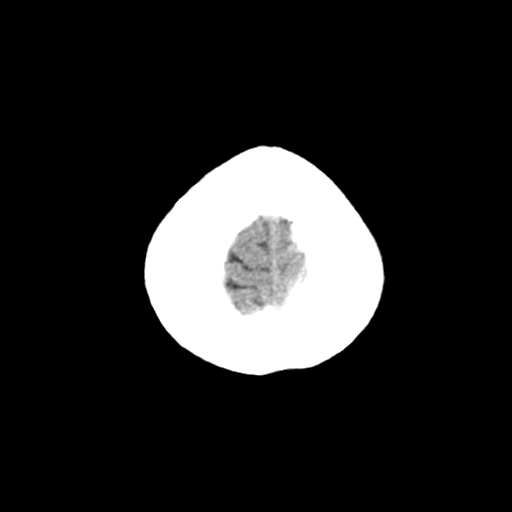

[Series 4: head bone · axial · 0.43mm/px · z∈[+199,+255]mm · 4 of 82 slices shown]
[im 9/82  bone]
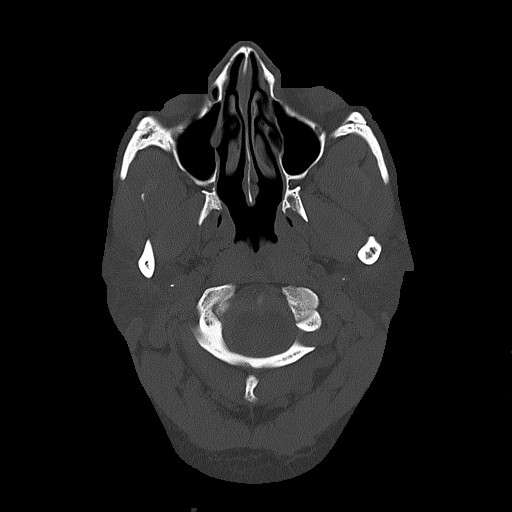
[im 17/82  bone]
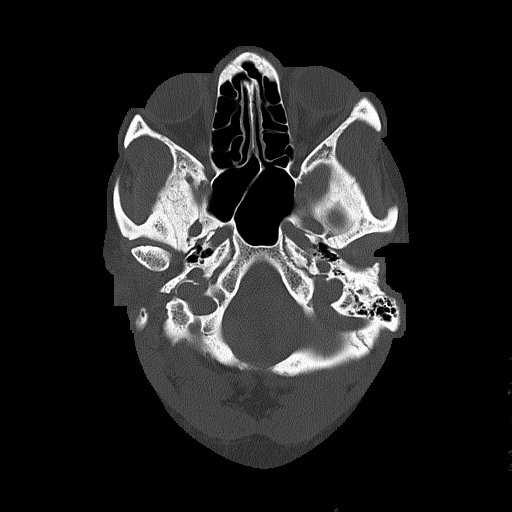
[im 25/82  bone]
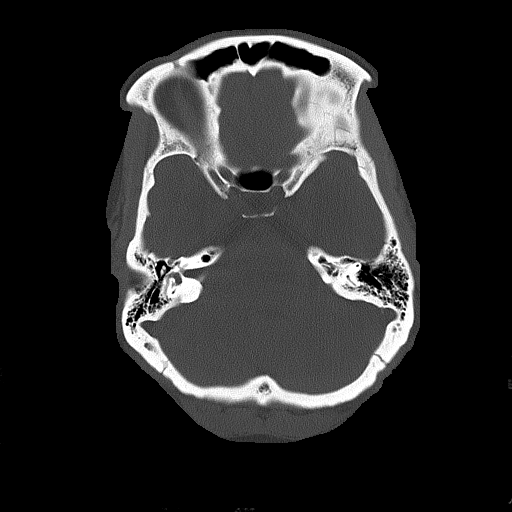
[im 37/82  bone]
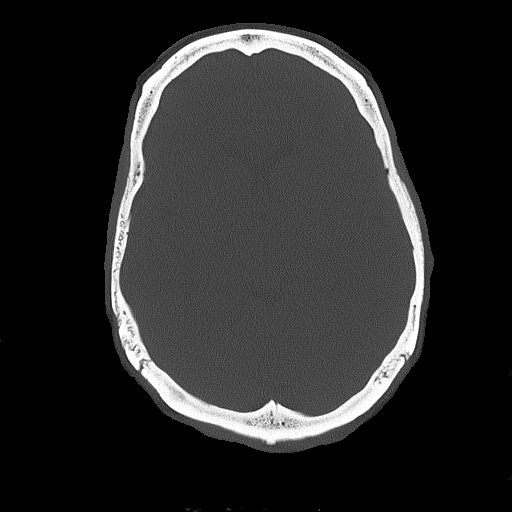

[Series 5: coronal soft tissue · coronal · 0.35mm/px · 3 of 69 slices shown]
[im 23/69  brain]
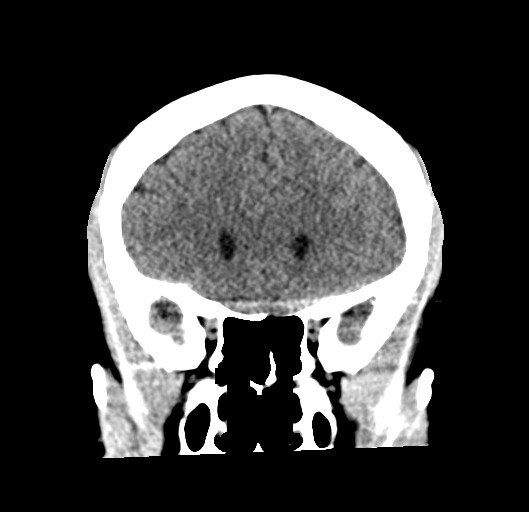
[im 31/69  brain]
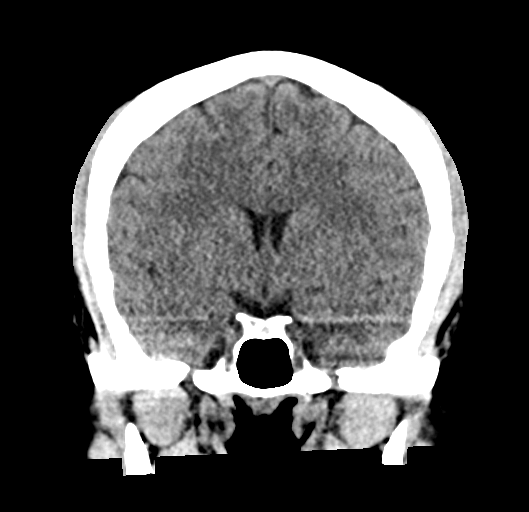
[im 38/69  brain]
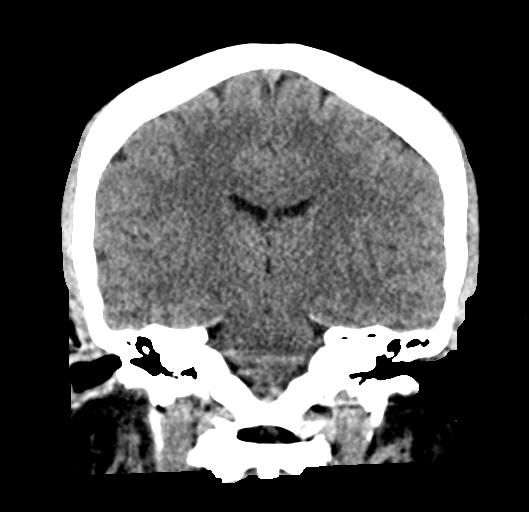

[Series 6: sagittal soft tissue · sagittal · 0.35mm/px · 3 of 63 slices shown]
[im 21/63  brain]
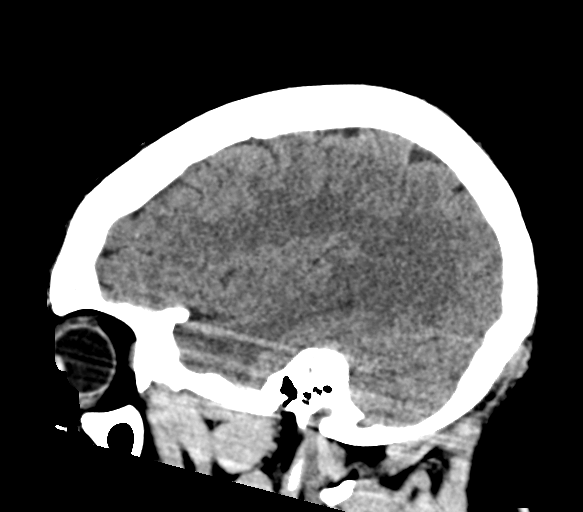
[im 32/63  brain]
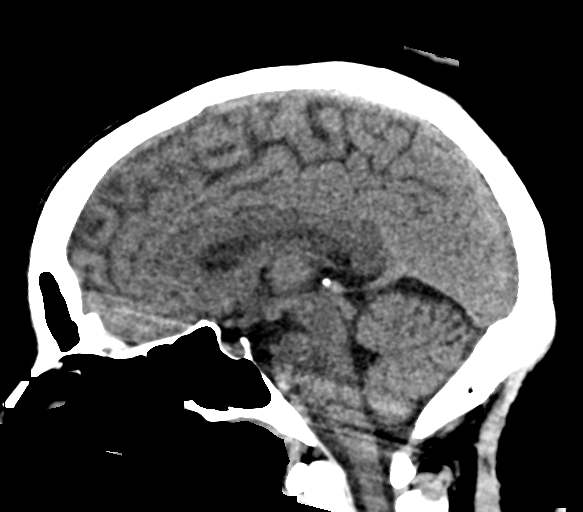
[im 42/63  brain]
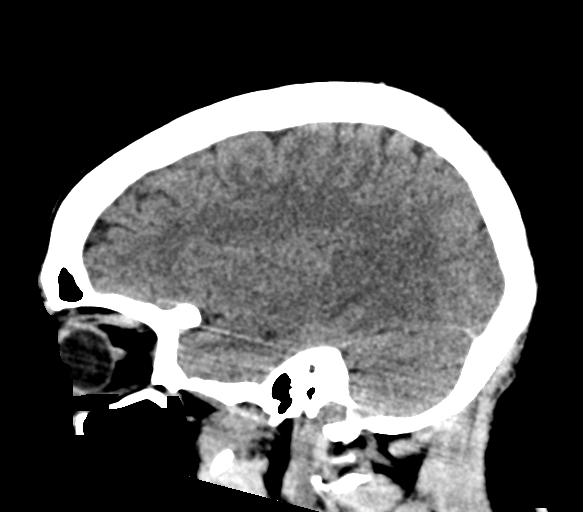

[17 of 47 positions shown; findings below may reference images not displayed]

FINDINGS: Brain: No evidence of acute infarction, hemorrhage, hydrocephalus,
extra-axial collection or mass lesion/mass effect.

Vascular: No hyperdense vessel or unexpected calcification.

Skull: Normal. Negative for fracture or focal lesion.

Sinuses/Orbits: No acute finding.

Other: These results were communicated to Dr. LIV UNNI at [DATE] on
[DATE] by text page via the [employer] messaging system.

ASPECTS ([place]ta Stroke Program Early CT Score)

- Ganglionic level infarction (caudate, lentiform nuclei, internal
capsule, insula, M1-M3 cortex): 7

- Supraganglionic infarction (M4-M6 cortex): 3

Total score (0-10 with 10 being normal): 10
IMPRESSION: 1. Negative head CT.
2. ASPECTS is  10.

## 2021-02-13 IMAGING — MR MR CERVICAL SPINE WO/W CM
5 of 8 series · 29 of 48 positions shown · IV contrast (gadavist)
Comparison: None.

CLINICAL DATA: Left facial weakness with numbness and vision loss.

EXAM:
MRI CERVICAL AND THORACIC SPINE WITHOUT AND WITH CONTRAST
TECHNIQUE: Multiplanar and multiecho pulse sequences of the cervical spine, to
include the craniocervical junction and cervicothoracic junction,
and the thoracic spine, were obtained without and with intravenous
contrast.
CONTRAST:  7.5mL GADAVIST GADOBUTROL 1 MMOL/ML IV SOLN

[Series 10: T2 · sagittal · 3.0mm · 0.62mm/px · 5 of 15 slices shown (1 of 2)]
[im 1/15]
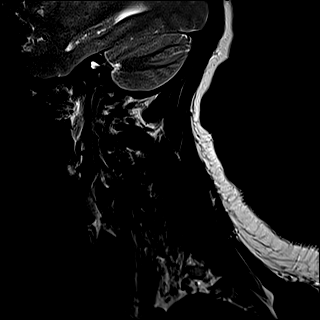
[im 4/15]
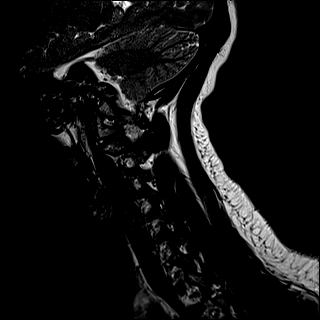
[im 8/15]
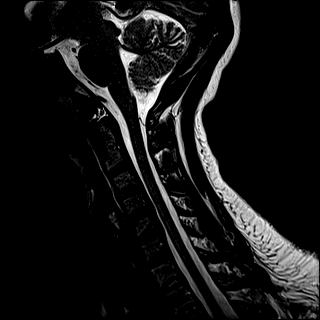
[im 11/15]
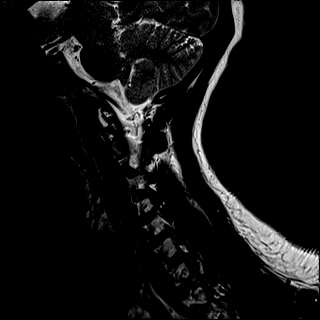
[im 15/15]
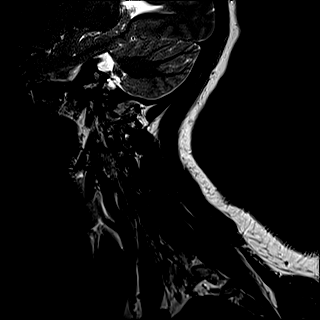

[Series 12: STIR · sagittal · 3.0mm · 0.62mm/px · 4 of 15 slices shown]
[im 1/15]
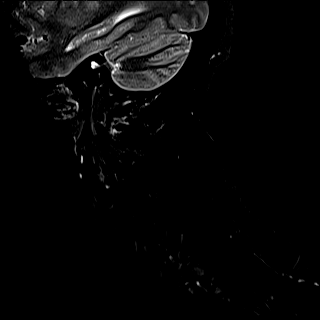
[im 5/15]
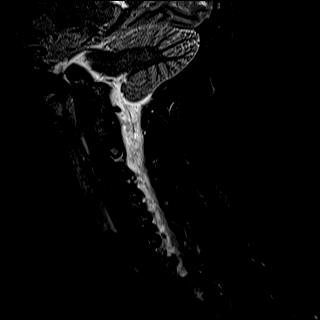
[im 10/15]
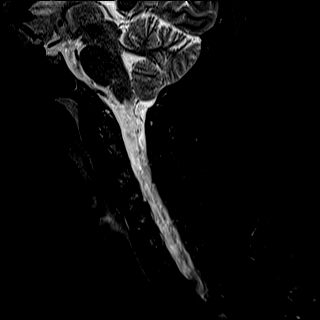
[im 15/15]
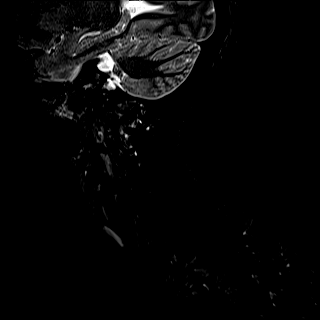

[Series 13: T2 · axial · 3.0mm · 0.70mm/px · z∈[-137,-49]mm · 8 of 29 slices shown (2 of 2)]
[im 1/29]
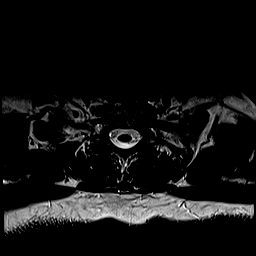
[im 5/29]
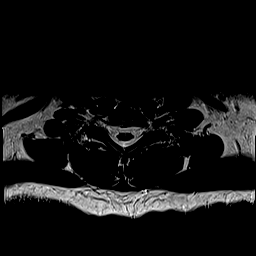
[im 9/29]
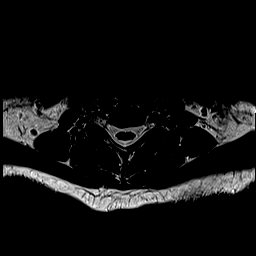
[im 13/29]
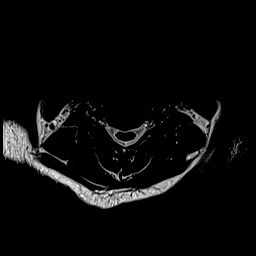
[im 17/29]
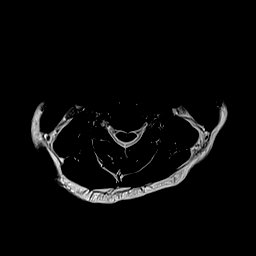
[im 21/29]
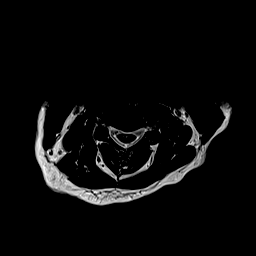
[im 25/29]
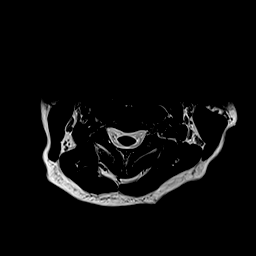
[im 29/29]
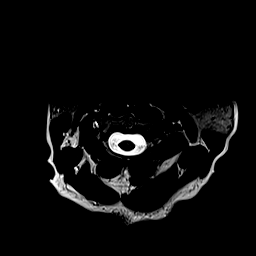

[Series 15: T1 · axial · non-contrast · 3.0mm · 0.35mm/px · z∈[-137,-49]mm · 8 of 28 slices shown]
[im 1/28]
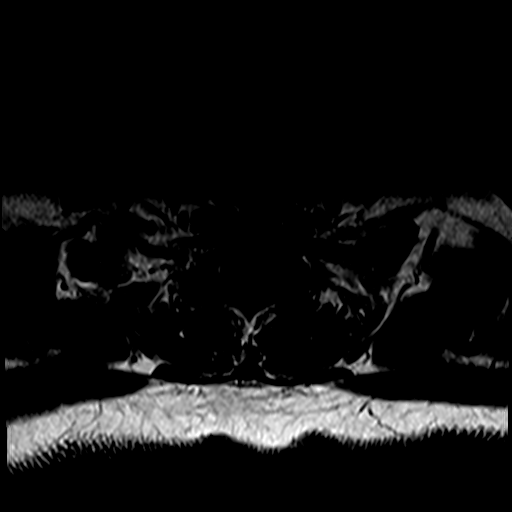
[im 4/28]
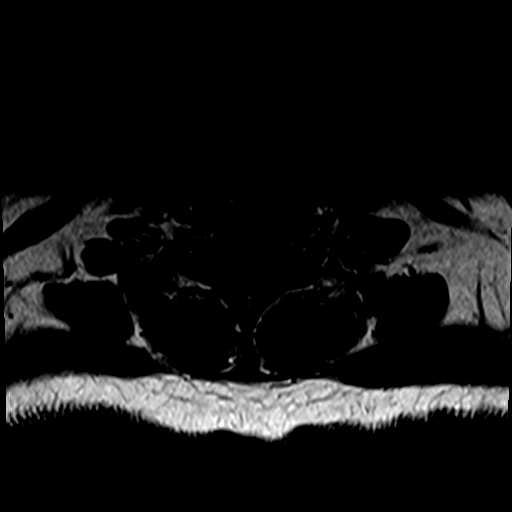
[im 8/28]
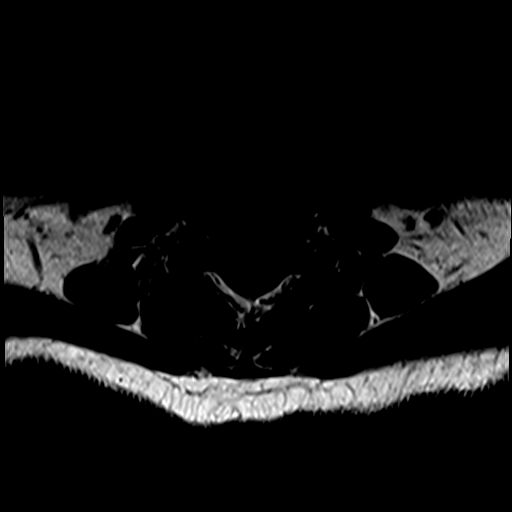
[im 12/28]
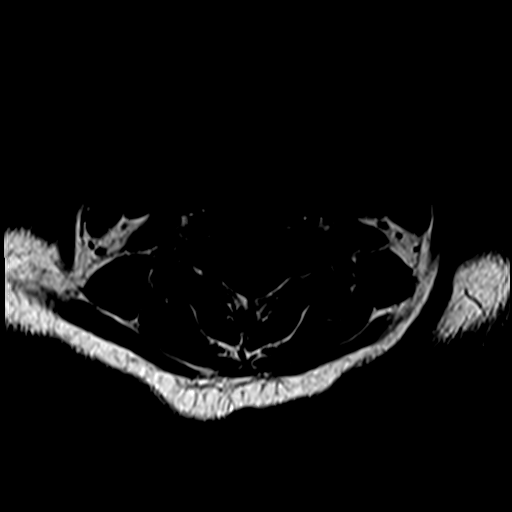
[im 16/28]
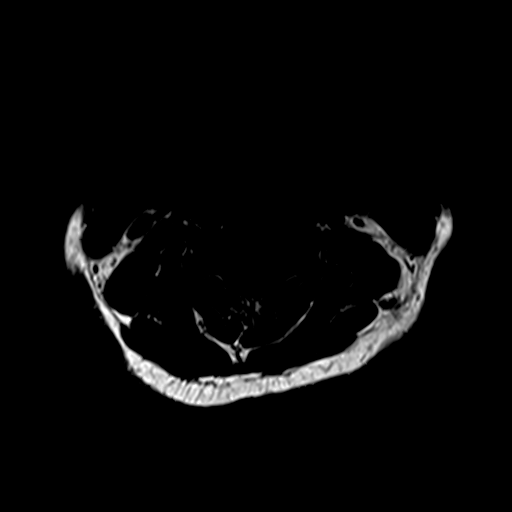
[im 20/28]
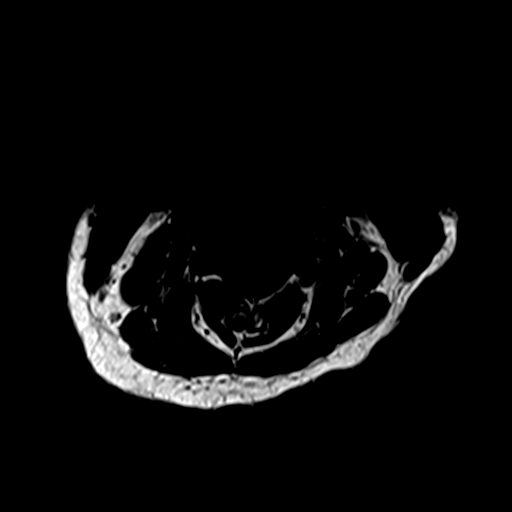
[im 24/28]
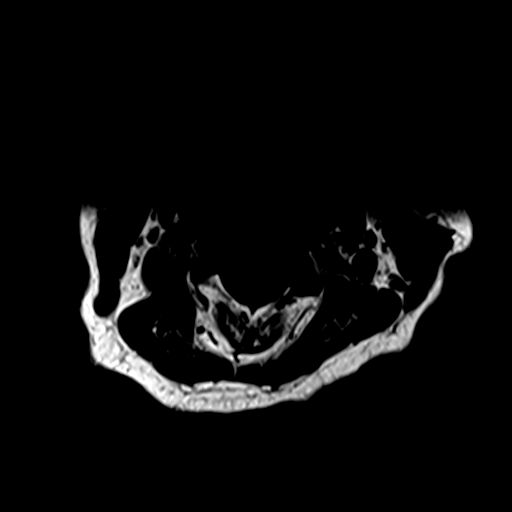
[im 28/28]
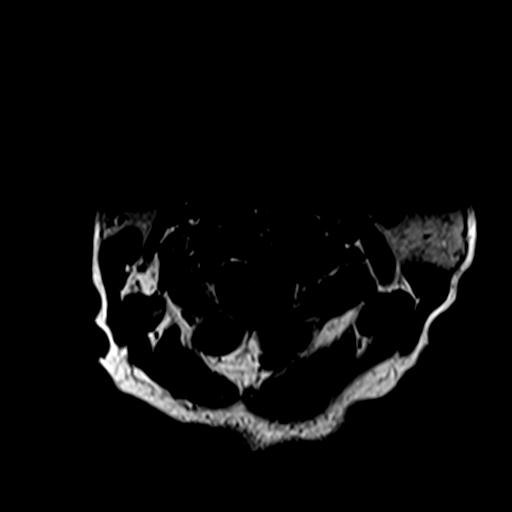

[Series 17: T1 post-contrast · axial · 3.0mm · 0.35mm/px · z∈[-137,-99]mm · 4 of 26 slices shown]
[im 1/26]
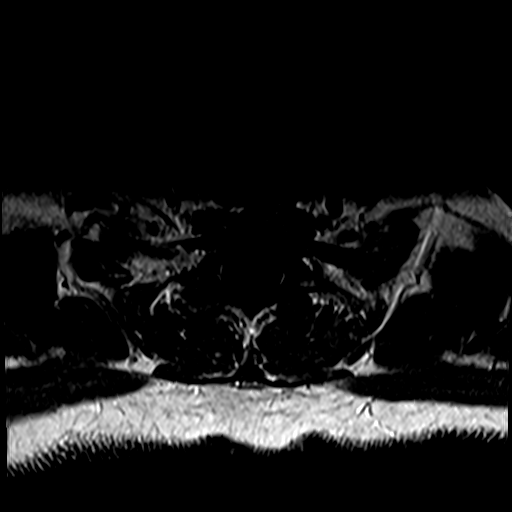
[im 5/26]
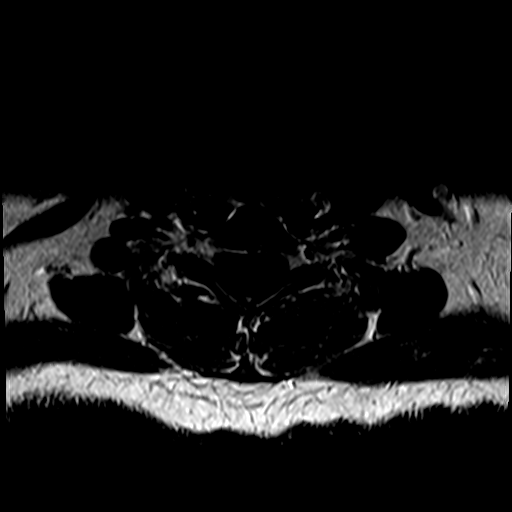
[im 9/26]
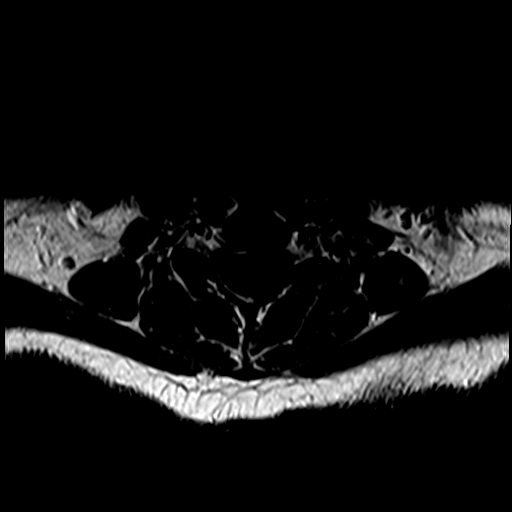
[im 13/26]
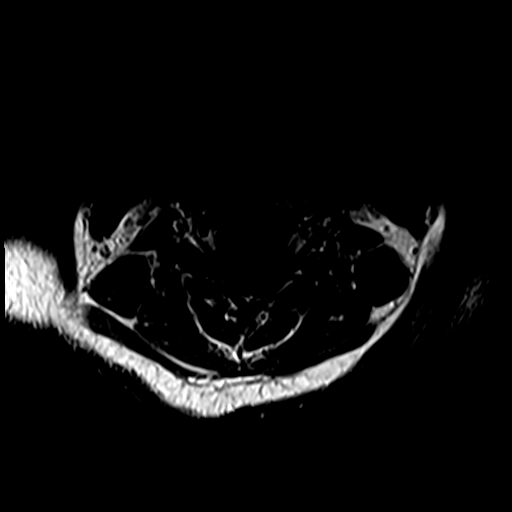

[29 of 48 positions shown; findings below may reference images not displayed]

FINDINGS: MRI CERVICAL SPINE FINDINGS

Alignment: Physiologic.

Vertebrae: No acute or suspicious osseous findings. No abnormal
osseous enhancement.

Cord: Normal in signal and caliber.

Posterior Fossa, vertebral arteries, paraspinal tissues: Visualized
portions of the posterior fossa appear unremarkable. No significant
paraspinal findings. Bilateral vertebral artery flow voids.

Disc levels:

No significant disc space findings at C2-3 or C3-4.

C4-5: Small central disc protrusion. No cord deformity or foraminal
compromise.

C5-6: Small right paracentral disc protrusion. No cord deformity or
foraminal compromise.

C6-7: Tiny right paracentral disc protrusion. No cord deformity or
foraminal compromise.

C7-T1: Normal interspace.

MRI THORACIC SPINE FINDINGS

Alignment:  Normal.

Vertebrae: No evidence of acute fracture, focal lesion or abnormal
enhancement.

Cord: The thoracic cord is normal in signal and caliber. Conus
medullaris extends to the L1 level. The spinal canal is widely
patent. No abnormal intradural enhancement.

Paraspinal and other soft tissues: Unremarkable.

Disc levels:

No significant thoracic disc herniation, spinal stenosis or nerve
root encroachment.
IMPRESSION: 1. No acute findings or explanation for the patient's symptoms.
2. The cervicothoracic cord appears normal.
3. Small cervical disc protrusions without cord deformity or nerve
root encroachment. No significant thoracic disc space findings.

## 2021-02-13 MED ORDER — ASPIRIN EC 81 MG PO TBEC
81.0000 mg | DELAYED_RELEASE_TABLET | Freq: Every day | ORAL | Status: DC
  Administered 2021-02-14 (×2): 81 mg via ORAL
  Filled 2021-02-13 (×2): qty 1

## 2021-02-13 MED ORDER — IOHEXOL 350 MG/ML SOLN
100.0000 mL | Freq: Once | INTRAVENOUS | Status: AC | PRN
  Administered 2021-02-13: 11:00:00 100 mL via INTRAVENOUS

## 2021-02-13 MED ORDER — STROKE: EARLY STAGES OF RECOVERY BOOK: Freq: Once | Status: DC

## 2021-02-13 MED ORDER — SODIUM CHLORIDE 0.9 % IV SOLN: INTRAVENOUS | Status: DC

## 2021-02-13 MED ORDER — ACETAMINOPHEN 160 MG/5ML PO SOLN
650.0000 mg | ORAL | Status: DC | PRN
  Filled 2021-02-13: qty 20.3

## 2021-02-13 MED ORDER — ACETAMINOPHEN 650 MG RE SUPP: 650.0000 mg | RECTAL | Status: DC | PRN

## 2021-02-13 MED ORDER — SODIUM CHLORIDE 0.9 % IV SOLN: INTRAVENOUS | Status: AC

## 2021-02-13 MED ORDER — ALPRAZOLAM 0.5 MG PO TABS
2.0000 mg | ORAL_TABLET | Freq: Once | ORAL | Status: AC
  Administered 2021-02-13: 12:00:00 2 mg via ORAL
  Filled 2021-02-13: qty 4

## 2021-02-13 MED ORDER — LORAZEPAM 2 MG/ML IJ SOLN
0.5000 mg | Freq: Once | INTRAMUSCULAR | Status: AC
  Administered 2021-02-14: 07:00:00 0.5 mg via INTRAVENOUS
  Filled 2021-02-13 (×2): qty 1

## 2021-02-13 MED ORDER — SODIUM CHLORIDE 0.9% FLUSH: 3.0000 mL | Freq: Once | INTRAVENOUS | Status: DC

## 2021-02-13 MED ORDER — ACETAMINOPHEN 325 MG PO TABS
650.0000 mg | ORAL_TABLET | ORAL | Status: DC | PRN
  Administered 2021-02-14 – 2021-02-15 (×3): 650 mg via ORAL
  Filled 2021-02-13 (×3): qty 2

## 2021-02-13 MED ORDER — GADOBUTROL 1 MMOL/ML IV SOLN
7.5000 mL | Freq: Once | INTRAVENOUS | Status: AC | PRN
  Administered 2021-02-13: 17:00:00 7.5 mL via INTRAVENOUS

## 2021-02-13 NOTE — ED Notes (Signed)
Pt requested food. Stroke swallow screen passed. Dr Archie Balboa gave verbal approval. Pt given [place] sandwich tray.

## 2021-02-13 NOTE — ED Provider Notes (Signed)
Emergency Medicine Provider Triage Evaluation Note  Brenda Savage , a 45 y.o. female  was evaluated in triage.  Pt complains of multiple neurologic complaints including left-sided blurred vision, left facial numbness, left upper extremity weakness/numbness and left lower extremity weakness/numbness.  Patient states that the blurred vision began this morning at approximately 5:30 AM.  Patient states that these other complaints including facial numbness and upper and lower extremity weakness/numbness on the left began yesterday and have been intermittent since onset.  Patient does endorse family history of stroke  Review of Systems  Positive: Blurred vision, left facial numbness, left upper and lower extremity numbness/weakness Negative: Syncope, headache, fever, bowel/bladder incontinence  Physical Exam  LMP 01/15/2021  Gen:   Awake, no distress   Resp:  Normal effort  MSK:   Moves extremities without difficulty  Other:  Decree sensation over the left face, decreased grip strength in the left upper extremity, decreased visual acuity in the left eye  Medical Decision Making  Medically screening exam initiated at 10:38 AM.  Appropriate orders placed.  Zeynep Fantroy was informed that the remainder of the evaluation will be completed by another provider, this initial triage assessment does not replace that evaluation, and the importance of remaining in the ED until their evaluation is complete.     Naaman Plummer, MD 02/13/21 1040

## 2021-02-13 NOTE — Progress Notes (Signed)
Chaplain Maggie responded to Code Stroke to CT. Patient was unavailable. Silent prayer was offered for pt and staff. Continued support available per on call chaplain.

## 2021-02-13 NOTE — ED Notes (Signed)
Pt assisted up to toilet by this RN and ED tech at this time.

## 2021-02-13 NOTE — ED Notes (Signed)
Patient transported to MRI 

## 2021-02-13 NOTE — ED Triage Notes (Signed)
Pt to ED via POV, Pt states that yesterday around 2 pm she started having some left sided facial numbness. Pt states that she also noticed that her balance was off and that she just did not feel right. Pt states that this morning around 0530 she noted that she was having intermittent weakness in the left arm and intermittent visual changes in the left eye. Pt states that she has family hx/o stroke.   No facial droop noted at this time. Pt does have weakness in the left arm and left leg. Pt reports decreased sensation on the left side of her face.   No slurred speech noted.

## 2021-02-13 NOTE — ED Notes (Signed)
Pt assisted up to toilet with this RN. Pt became extremely weak and left leg gave out. RN had to hold pt up to prevent fall. Pt guided over to toilet and then was able to stand again to get pants down. This RN obtained assistance from ED tech to move pt back to bed.

## 2021-02-13 NOTE — ED Provider Notes (Signed)
Patient's MRI shows likely meningioma. No other concerning abnormality. Neurology reviewed MRI and recommends admission for further work up and evaluation. Discussed LP with patient. At this time she would like to defer to IR for image guidance. I think this is reasonable given low concern for bleed/meningitis. Will admit to hospitalist service.     Nance Pear, MD 02/13/21 2014

## 2021-02-13 NOTE — H&P (Addendum)
History and Physical    Brenda Savage VAN:[id number] DOB: 16-Sep-1975 DOA: 02/13/2021  PCP: Pcp, No    Patient coming from:  Home    Chief Complaint:  Paresthesia.   HPI:  Lashaye Fisk is a 45 y.o. female seen in ed with complaints of extremity weakness and numbness on the left side along with blurred vision she also had facial numbness on the left side that began yesterday.  Patient reports shortness of breath no complaints of dizziness, syncope, headaches, incontinence, chest pain ,speech or gait issues fevers chills bladder issues.  Review of systems is negative.  States that she has always had anemia and has had a colonoscopy and was told that was okay.  She reports pica symptoms with her anemia. Pt has past medical history of microcytic anemia, anxiety, chest pain.  ED Course:  Vitals:   02/13/21 1830 02/13/21 1900 02/13/21 1930 02/13/21 2000  BP: 120/79 132/85 134/89 117/90  Pulse: 62 63 60 63  Resp: _0 Temp:      TempSrc:      SpO2: 100% 100% 100% 100%  Weight:      Height:      The emergency room patient is alert awake and oriented.  Vitals are stable. Blood work shows CMP, CBC shows normal white count of 4.7 hemoglobin of 9.5 MCV of 71.1 platelet count of 313 RDW of 19.4. Code stroke was called and patient seen by neurology. Patient had an MRI of the brain with and without contrast which showed no evidence of infarct, showed a 7 x 4 mm right parietal lobe mass suspicious for small meningioma, bilateral ethmoid and maxillary sinus disease.  Disease is Of the C-spine is within normal limits, the T-spine.   Review of Systems:  Review of Systems  Constitutional:  Positive for malaise/fatigue.  Respiratory:  Positive for shortness of breath.   Cardiovascular:  Positive for chest pain.  Neurological:  Positive for focal weakness. Negative for weakness.  All other systems reviewed and are negative.   Past Medical History:  Diagnosis Date   Anemia     Anxiety    Chest pain     Past Surgical History:  Procedure Laterality Date   CHOLECYSTECTOMY     DILATION AND CURETTAGE OF UTERUS       reports that she has never smoked. She has never used smokeless tobacco. She reports that she does not currently use alcohol. She reports that she does not currently use drugs.  No Known Allergies  Family History  Problem Relation Age of Onset   Hypertension Mother    Diabetes Mother    Atrial fibrillation Mother    Hypertension Father     Prior to Admission medications   Medication Sig Start Date End Date Taking? Authorizing Provider  benzonatate (TESSALON) 100 MG capsule Take 2 capsules (200 mg total) by mouth every 8 (eight) hours. 01/18/21   Margarette Canada, NP  ipratropium (ATROVENT) 0.06 % nasal spray Place 2 sprays into both nostrils 4 (four) times daily. 01/18/21   Margarette Canada, NP  Norethindrone Acetate-Ethinyl Estrad-FE (LOESTRIN 24 FE) 1-20 MG-MCG(24) tablet Take 1 tablet by mouth daily. 04/02/20 04/02/21  [provider]  oseltamivir (TAMIFLU) 75 MG capsule Take 75 mg by mouth 2 (two) times daily. 01/17/21   [provider]  promethazine-dextromethorphan (PROMETHAZINE-DM) 6.25-15 MG/5ML syrup Take 5 mLs by mouth 4 (four) times daily as needed. 01/18/21   Margarette Canada, NP    Physical Exam: Vitals:  02/13/21 1830 02/13/21 1900 02/13/21 1930 02/13/21 2000  BP: 120/79 132/85 134/89 117/90  Pulse: 62 63 60 63  Resp: _0 Temp:      TempSrc:      SpO2: 100% 100% 100% 100%  Weight:      Height:       Physical Exam Vitals reviewed.  Constitutional:      Appearance: Normal appearance.  HENT:     Head: Normocephalic and atraumatic.     Right Ear: External ear normal.     Left Ear: External ear normal.     Nose: Nose normal.     Mouth/Throat:     Mouth: Mucous membranes are moist.  Eyes:     Extraocular Movements: Extraocular movements intact.     Pupils: Pupils are equal, round, and reactive to light.   Neck:     Vascular: No carotid bruit.  Cardiovascular:     Rate and Rhythm: Normal rate and regular rhythm.     Pulses: Normal pulses.     Heart sounds: Normal heart sounds.  Pulmonary:     Effort: Pulmonary effort is normal.     Breath sounds: Normal breath sounds.  Abdominal:     General: Bowel sounds are normal. There is no distension.     Palpations: Abdomen is soft. There is no mass.     Tenderness: There is no abdominal tenderness. There is no guarding.     Hernia: No hernia is present.  Musculoskeletal:     Right lower leg: No edema.     Left lower leg: No edema.  Skin:    General: Skin is warm.  Neurological:     General: No focal deficit present.     Mental Status: She is alert and oriented to person, place, and time.     Cranial Nerves: Cranial nerves 2-12 are intact. No cranial nerve deficit, dysarthria or facial asymmetry.     Motor: No weakness, tremor, atrophy, abnormal muscle tone, seizure activity or pronator drift.     Coordination: Coordination is intact.     Deep Tendon Reflexes: Reflexes normal.     Reflex Scores:      Bicep reflexes are 2+ on the right side and 2+ on the left side.      Patellar reflexes are 2+ on the right side and 2+ on the left side. Psychiatric:        Mood and Affect: Mood normal.        Behavior: Behavior normal.     Labs on Admission: I have personally reviewed following labs and imaging studies  No results for input(s): CKTOTAL, CKMB, TROPONINI in the last 72 hours. Lab Results  Component Value Date   WBC 4.7 02/13/2021   HGB 9.5 (L) 02/13/2021   HCT 31.3 (L) 02/13/2021   MCV 71.1 (L) 02/13/2021   PLT 313 02/13/2021    Recent Labs  Lab 02/13/21 1049  NA 136  K 3.8  CL 106  CO2 24  BUN 11  CREATININE 0.76  CALCIUM 9.1  PROT 7.4  BILITOT 0.6  ALKPHOS 54  ALT 17  AST 20  GLUCOSE 103*   No results found for: CHOL, HDL, LDLCALC, TRIG No results found for: DDIMER Invalid input(s): POCBNP   COVID-19 Labs No  results for input(s): DDIMER, FERRITIN, LDH, CRP in the last 72 hours. No results found for: [place]  Radiological Exams on Admission: MR Brain W and Wo Contrast  Result Date: 02/13/2021 CLINICAL  DATA:  Left facial weakness, numbness and vision loss. Left-sided weakness and dysmetria. EXAM: MRI HEAD WITHOUT AND WITH CONTRAST TECHNIQUE: Multiplanar, multiecho pulse sequences of the brain and surrounding structures were obtained without and with intravenous contrast. CONTRAST:  7.580m GADAVIST GADOBUTROL 1 MMOL/ML IV SOLN COMPARISON:  Noncontrast head CT and CT angiogram head/neck 02/13/2021. FINDINGS: Brain: Cerebral volume is normal. 7 x 4 mm dural-based homogeneously enhancing mass overlying the right parietal lobe, likely reflecting a small meningioma. No underlying parenchymal edema. No cortical encephalomalacia is identified. No significant cerebral white matter disease. There is no acute infarct. No definite chronic intracranial blood products. No extra-axial fluid collection. No midline shift. Vascular: Maintained flow voids within the proximal large arterial vessels. Skull and upper cervical spine: Focal suspicious marrow lesion. Sinuses/Orbits: Visualized orbits show no acute finding. Mild mucosal thickening within the bilateral maxillary sinuses. Trace mucosal thickening within the bilateral ethmoid sinuses. IMPRESSION: No evidence of acute infarct. 7 x 4 mm dural-based enhancing mass overlying the right parietal lobe, likely reflecting a small meningioma. No underlying parenchymal edema. Otherwise unremarkable MRI appearance of the brain. Mild bilateral ethmoid and maxillary sinus disease, as described. Electronically Signed   By: KKellie SimmeringD.O.   On: 02/13/2021 17:48   MR Cervical Spine W or Wo Contrast  Result Date: 02/13/2021 CLINICAL DATA:  Left facial weakness with numbness and vision loss. EXAM: MRI CERVICAL AND THORACIC SPINE WITHOUT AND WITH CONTRAST TECHNIQUE: Multiplanar and  multiecho pulse sequences of the cervical spine, to include the craniocervical junction and cervicothoracic junction, and the thoracic spine, were obtained without and with intravenous contrast. CONTRAST:  7.561mGADAVIST GADOBUTROL 1 MMOL/ML IV SOLN COMPARISON:  None. FINDINGS: MRI CERVICAL SPINE FINDINGS Alignment: Physiologic. Vertebrae: No acute or suspicious osseous findings. No abnormal osseous enhancement. Cord: Normal in signal and caliber. Posterior Fossa, vertebral arteries, paraspinal tissues: Visualized portions of the posterior fossa appear unremarkable. No significant paraspinal findings. Bilateral vertebral artery flow voids. Disc levels: No significant disc space findings at C2-3 or C3-4. C4-5: Small central disc protrusion. No cord deformity or foraminal compromise. C5-6: Small right paracentral disc protrusion. No cord deformity or foraminal compromise. C6-7: Tiny right paracentral disc protrusion. No cord deformity or foraminal compromise. C7-T1: Normal interspace. MRI THORACIC SPINE FINDINGS Alignment:  Normal. Vertebrae: No evidence of acute fracture, focal lesion or abnormal enhancement. Cord: The thoracic cord is normal in signal and caliber. Conus medullaris extends to the L1 level. The spinal canal is widely patent. No abnormal intradural enhancement. Paraspinal and other soft tissues: Unremarkable. Disc levels: No significant thoracic disc herniation, spinal stenosis or nerve root encroachment. IMPRESSION: 1. No acute findings or explanation for the patient's symptoms. 2. The cervicothoracic cord appears normal. 3. Small cervical disc protrusions without cord deformity or nerve root encroachment. No significant thoracic disc space findings. Electronically Signed   By: WiRichardean Sale.D.   On: 02/13/2021 17:37   MR THORACIC SPINE W WO CONTRAST  Result Date: 02/13/2021 CLINICAL DATA:  Left facial weakness with numbness and vision loss. EXAM: MRI CERVICAL AND THORACIC SPINE WITHOUT AND  WITH CONTRAST TECHNIQUE: Multiplanar and multiecho pulse sequences of the cervical spine, to include the craniocervical junction and cervicothoracic junction, and the thoracic spine, were obtained without and with intravenous contrast. CONTRAST:  7.80m94mADAVIST GADOBUTROL 1 MMOL/ML IV SOLN COMPARISON:  None. FINDINGS: MRI CERVICAL SPINE FINDINGS Alignment: Physiologic. Vertebrae: No acute or suspicious osseous findings. No abnormal osseous enhancement. Cord: Normal in signal and caliber. Posterior Fossa, vertebral  arteries, paraspinal tissues: Visualized portions of the posterior fossa appear unremarkable. No significant paraspinal findings. Bilateral vertebral artery flow voids. Disc levels: No significant disc space findings at C2-3 or C3-4. C4-5: Small central disc protrusion. No cord deformity or foraminal compromise. C5-6: Small right paracentral disc protrusion. No cord deformity or foraminal compromise. C6-7: Tiny right paracentral disc protrusion. No cord deformity or foraminal compromise. C7-T1: Normal interspace. MRI THORACIC SPINE FINDINGS Alignment:  Normal. Vertebrae: No evidence of acute fracture, focal lesion or abnormal enhancement. Cord: The thoracic cord is normal in signal and caliber. Conus medullaris extends to the L1 level. The spinal canal is widely patent. No abnormal intradural enhancement. Paraspinal and other soft tissues: Unremarkable. Disc levels: No significant thoracic disc herniation, spinal stenosis or nerve root encroachment. IMPRESSION: 1. No acute findings or explanation for the patient's symptoms. 2. The cervicothoracic cord appears normal. 3. Small cervical disc protrusions without cord deformity or nerve root encroachment. No significant thoracic disc space findings. Electronically Signed   By: Richardean Sale M.D.   On: 02/13/2021 17:37   CT HEAD CODE STROKE WO CONTRAST  Result Date: 02/13/2021 CLINICAL DATA:  Code stroke. Left-sided weakness in the arm and leg starting  at 5:30 a.m. EXAM: CT HEAD WITHOUT CONTRAST TECHNIQUE: Contiguous axial images were obtained from the base of the skull through the vertex without intravenous contrast. COMPARISON:  None. FINDINGS: Brain: No evidence of acute infarction, hemorrhage, hydrocephalus, extra-axial collection or mass lesion/mass effect. Vascular: No hyperdense vessel or unexpected calcification. Skull: Normal. Negative for fracture or focal lesion. Sinuses/Orbits: No acute finding. Other: These results were communicated to Dr. Curly Shores at 10:51 am on 02/13/2021 by text page via the [facility] messaging system. ASPECTS [facility] Stroke Program Early CT Score) - Ganglionic level infarction (caudate, lentiform nuclei, internal capsule, insula, M1-M3 cortex): 7 - Supraganglionic infarction (M4-M6 cortex): 3 Total score (0-10 with 10 being normal): 10 IMPRESSION: 1. Negative head CT. 2. ASPECTS is  10. Electronically Signed   By: Jorje Guild M.D.   On: 02/13/2021 10:52   CT ANGIO HEAD NECK W WO CM W PERF (CODE STROKE)  Result Date: 02/13/2021 CLINICAL DATA:  Acute stroke suspected EXAM: CT ANGIOGRAPHY HEAD AND NECK TECHNIQUE: Multidetector CT imaging of the head and neck was performed using the standard protocol during bolus administration of intravenous contrast. Multiplanar CT image reconstructions and MIPs were obtained to evaluate the vascular anatomy. Carotid stenosis measurements (when applicable) are obtained utilizing NASCET criteria, using the distal internal carotid diameter as the denominator. CONTRAST:  128m OMNIPAQUE IOHEXOL 350 MG/ML SOLN COMPARISON:  Noncontrast head CT from earlier today. FINDINGS: CTA NECK FINDINGS Aortic arch: Unremarkable Right carotid system: Vessels are smooth and widely patent. 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  Skeleton: Unremarkable Other neck: No acute finding Upper chest: Negative Review of the MIP images confirms the above findings CTA HEAD FINDINGS Anterior circulation: Vessels are smooth and widely patent. Negative for aneurysm. Posterior circulation: Vessels are smooth and widely patent. Negative for aneurysm. Venous sinuses: Diffusely patent Anatomic variants: Hypoplastic right A1 segment Review of the MIP images confirms the above findings IMPRESSION: No emergent finding or explanation for symptoms. Electronically Signed   By: JJorje GuildM.D.   On: 02/13/2021 11:22    EKG: Independently reviewed.  Sinus rhythm 67   Assessment/Plan:  Principal Problem:  Left-sided weakness Active Problems:   Paresthesia   Goiter, non-toxic   Shortness of breath   Anemia Left-sided weakness/paresthesia: We will admit to telemetry unit. Mri c spine as mentioned above.  We will also obtain eeg. We will obtain ana/ esr./lyme, b12,  Anemia panel and vit d level, I have seen commonly where low ferritin and know keve Appreciate neurology consult - MRI  BRAIN, C-SPINE- T- spine with and without contrast. LP.and antiseizure meds until we get EEG.   Goiter: Ft4/ tsh.   SOB: Pt is on OCP we will check dimer / low threshold of r cta.    Anemia:  Microcytic anemia  we will get anemia panel and iv ppi.      DVT prophylaxis:  SCDs  Code Status:  Full code  Family Communication:  Rosita, Guzzetta (Spouse)  [phone] (Mobile)   Disposition Plan:  Home  Consults called:  Neurology Dr. Danie Chandler  Admission status: Inpatient   Para Skeans MD [facility] [phone] How to contact the [facility] Attending or Consulting provider [place] or covering provider during after hours [place], for this patient.    Check the care team in [facility] and look for a) attending/consulting [facility] provider listed and b) the [facility] team listed Log into [email or web address] and use [place]'s universal password to  access. If you do not have the password, please contact the hospital operator. Locate the [facility] provider you are looking for under [facility] and page to a number that you can be directly reached. If you still have difficulty reaching the provider, please page the [facility] (Director on Call) for the Hospitalists listed on amion for assistance. [email or web address] Password [facility] 02/13/2021, 9:08 PM

## 2021-02-13 NOTE — Consult Note (Addendum)
Neurology Consultation Reason for Consult: Code stroke for left sided weakness and left eye blurred vision Requesting Physician: Vladimir Crofts  CC: Left eye vision issues and sided weakness  History is obtained from: Patient, chart review  HPI: Brenda Savage is a 45 y.o. 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  She reports that she had the flu on 01/18/2021 and then subsequently had her flu shot approximately 1 week later.  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  Approximately 1 week ago she felt like there was some subtle sensation change in bilateral lower extremities (weakness and burning sort of pain).  She was able to work through this and then subsequently developed some left facial numbness yesterday around 2 PM.  This morning around 5:15 AM she noted she is having some left eye visual blurriness and felt weak in the left upper extremity as well.  She has had about 2 days of neck stiffness.  Symptoms are also associated with mild headache but does not appear to be positional.  She has not had any other fevers etc. since the flu.  She has a mild restless leg sensation in her legs as well as some shortness of breath.  She did have a positive ANA in the setting of evaluation for pruritus, and was determined by rheumatology E consult to not meet criteria for inflammatory or autoimmune rheumatic conditions noting that 1 out of 6 women have positive ANA, ESR elevation was felt to be secondary to her anemia and CRP elevation reflective of an upper respiratory infection  LKW: 2 PM on 11/26 tPA given?: No, out of the window IA performed?: No, no LVO Premorbid modified rankin scale:      0 - No symptoms. ROS: All other review of systems was negative except as noted in the HPI.   Past Medical History:  Diagnosis Date    Anemia    Anxiety    Chest pain    Past Surgical History:  Procedure Laterality Date   CHOLECYSTECTOMY     DILATION AND CURETTAGE OF UTERUS     Current Outpatient Medications  Medication Instructions   benzonatate (TESSALON) 200 mg, Oral, Every 8 hours   ipratropium (ATROVENT) 0.06 % nasal spray 2 sprays, Each Nare, 4 times daily   Norethindrone Acetate-Ethinyl Estrad-FE (LOESTRIN 24 FE) 1-20 MG-MCG(24) tablet 1 tablet, Oral, Daily   oseltamivir (TAMIFLU) 75 mg, Oral, 2 times daily   promethazine-dextromethorphan (PROMETHAZINE-DM) 6.25-15 MG/5ML syrup 5 mLs, Oral, 4 times daily PRN  -Medication reconciliation not yet completed by pharmacy.  Per outside records and confirmed by myself with patient, she is also taking an oral contraceptive pill, Xanax 1 mg up to 2-3 times daily as needed (she reports she last took it 2 weeks ago and takes it about 1X monthly typically).  She has multiple vitamins on her medication list but stopped taking them due to expense (B6, zinc, vitamin C, biotin, calcium vitamin D, garlic, and dandelion root).   No family history on file. She reports a significant family history of congestive heart failure, diabetes, hypertension, stroke, and MI, but no family history of autoimmune diseases  Social History:  reports that she has never smoked. She has never used smokeless tobacco. She reports that she does not currently use alcohol. She reports that she does not currently use  drugs.   Exam: Current vital signs: BP (!) 139/95   Pulse 76   Temp 99.1 F (37.3 C) (Oral)   Resp 19   Ht 5' 5.5" (1.664 m)   Wt 80.7 kg   LMP 02/13/2021   SpO2 99%   BMI 29.17 kg/m  Vital signs in last 24 hours: Temp:  [99.1 F (37.3 C)] 99.1 F (37.3 C) (11/27 1116) Pulse Rate:  [76] 76 (11/27 1116) Resp:  [19] 19 (11/27 1116) BP: (139)/(95) 139/95 (11/27 1116) SpO2:  [99 %] 99 % (11/27 1116) Weight:  [80.7 kg] 80.7 kg (11/27 1117)   Physical Exam  Constitutional: Appears  well-developed and well-nourished.  Psych: Affect appropriate to situation, mildly anxious, slightly flat but cooperative Eyes: No scleral injection HENT: No oropharyngeal obstruction.  MSK: no joint deformities.  Cardiovascular: Normal rate and regular rhythm.  Respiratory: Effort normal, non-labored breathing GI: Soft.  No distension. There is no tenderness.  Skin: Warm dry and intact visible skin  Neuro: Mental Status: Patient is awake, alert, oriented to person, place, month, year, and situation. Patient is able to give a clear and coherent history. No signs of aphasia or neglect Cranial Nerves: II: Visual Fields are full in the right eye.  With the left eye she reports incorrect numbers of fingers in all quadrants without a clear pattern, occasionally getting it correct as well.  She reports loss of color saturation in the left eye compared to the right. Pupils are equal, round, and reactive to light, no clear APD although she does have significant hippus bilaterally.  Funduscopic exam revealed intact temporal optic disc on the left, could not completely evaluate nasal margin due to patient tolerance of examination III,IV, VI: EOMI without ptosis or diploplia.  V: Facial sensation is symmetric reduced in the left, splits midline VII: Facial movement is symmetric.  VIII: hearing is intact to voice X: Uvula elevates symmetrically XI: Head turning symmetric XII: tongue is midline without atrophy or fasciculations.  Motor: Tone is normal. Bulk is normal.  Her strength exam on the left is somewhat effort dependent, but she does have a slight pronator drift of the left upper extremity, with 4/5 on deltoids 4+/5 on biceps testing at best.  Left lower extremity similarly effort dependent but I can get at least a 4/5 throughout. Sensory: Reduced sensation in the left arm and leg compared to the right for both temperature and light touch.  Vibration sensation is intact 11 seconds in the right  great toe 5 seconds in the left great toe, absent at the left index DIP, normal at the right index DIP Deep Tendon Reflexes: 2+ right brachioradialis, 3+ left brachioradialis, 2+ and symmetric in the patellae, 2+ Achilles but slightly increased on the left compared to the right.  Plantars: Toes are downgoing bilaterally.  Cerebellar: FNF and HKS are intact bilaterally   I have reviewed labs in epic and the results pertinent to this consultation are: CMP within normal limits except for mildly elevated glucose at 103 CBC notable for anemia with hemoglobin of 9.5, microcytic, with elevated RDW (chronic per review of outside records since at least January 2020) Positive ANA 03/22/2018  I have reviewed the images obtained:  Head CT w/o acute intracranial process  Code stroke recommendations: CTA to rule out dissection given neck pain, personally reviewed and agree with radiology negative for acute intracranial process CT perfusion in case dissection/occlusion is detected to guide potential thrombectomy decision given patient is greater than 6 hours since  symptom onset, personally reviewed and negative for core infarct or tissue at risk  Impression: 45 year old woman with no significant past medical history presenting with progressive neurological changes over the past 1 week.  There is some element of functional overlay on her examination, but she does appear to have some potential for organic weakness on the left side  Recommendations: -MRI brain, C-spine and T-spine with and without contrast -Ativan 2 mg so that patient can tolerate scan  Further recommendations pending this study [ see below]  [place] [phone] [facility] coverage for [facility] is from 8 AM to 4 AM in-house and 4 PM to 8 PM by telephone/video. 8 PM to 8 AM emergent questions or overnight urgent questions should be addressed to Teleneurology On-call or [facility]  neurohospitalist; contact information can be found on AMION  Total critical care time: 50 minutes   Critical care time was exclusive of separately billable procedures and treating other patients.   Critical care was necessary to treat or prevent imminent or life-threatening deterioration, evaluating for suitability of treatment with thrombolytic agent in an emergent fashion.   Critical care was time spent personally by me on the following activities: development of treatment plan with patient and/or surrogate as well as nursing, discussions with consultants/primary team, evaluation of patient's response to treatment, examination of patient, obtaining history from patient or surrogate, ordering and performing treatments and interventions, ordering and review of laboratory studies, ordering and review of radiographic studies, and re-evaluation of patient's condition as needed, as documented above.   Additional impression/recommendations based on MRI:   MRIs personally reviewed, agree there is a right parietal lesion which could explain her left sided sensory symptoms -- focal seizure would be a possibility but the vision symptoms she describes are somewhat atypical for seizure.  Additionally this does not explain her bilateral leg weakness that she noticed initially. Question whether there is an additional leptomeningeal process difficult to pick up convincingly on MRI.   Additional recommendations. -Lumbar puncture with opening pressure, cyptopathology, cell counts in tubes 1 and 4, protein, glucose, gram stain and bacterial culture, please reserve at least an additional 10 cc (ideally 20-30 cc volume tap) for additional studies that may be needed based on preliminary studies (orders not yet placed, will need to be completed by EDP or IR)  -Routine EEG (ordered)  -Hold on antiseizure medications for now to increase yield of EEG but if the patient has activity concerning for seizure overnight, please  give 1500 mg keppra loading dose followed by 500 mg BID q12hr maintenance -Minimize further benzos at this time again to maximize yield of EEG.  -Management of remainder of comorbidities per ED/primary team -Neurology will continue to follow

## 2021-02-13 NOTE — ED Notes (Signed)
Responded to call bell. Pt requested to go to bathroom. Stretcher moved to commode and patient able to stand and pivot with assistance. Pt given fresh sanitary pad. Pt returned to bed and resting comfortably.

## 2021-02-13 NOTE — ED Provider Notes (Signed)
[facility] Emergency Department Provider Note ____________________________________________   None    (approximate)  I have reviewed the triage vital signs and the nursing notes.  HISTORY  Chief Complaint Code Stroke   HPI Brenda Savage is a 45 y.o. femalewho presents to the ED for evaluation of weakness  Chart review indicates minimal medical hx. Anxiety.  Patient presents to the ED via POV for evaluation of left-sided facial numbness, vision changes, left arm and leg weakness.  She reports mild and intermittent weakness to her left-sided extremities on a subacute timeframe, but starting this morning around 530 she developed a left-sided facial symptoms to include blurriness to the left-sided visual fields and numbness to the left side of her face.  Reports a mild headache alongside the symptoms this morning.  Denies fevers, syncopal episodes.  This has never happened before.  Past Medical History:  Diagnosis Date   Anemia    Anxiety    Chest pain     There are no problems to display for this patient.   Past Surgical History:  Procedure Laterality Date   CHOLECYSTECTOMY     DILATION AND CURETTAGE OF UTERUS      Prior to Admission medications   Medication Sig Start Date End Date Taking? Authorizing Provider  benzonatate (TESSALON) 100 MG capsule Take 2 capsules (200 mg total) by mouth every 8 (eight) hours. 01/18/21   Margarette Canada, NP  ipratropium (ATROVENT) 0.06 % nasal spray Place 2 sprays into both nostrils 4 (four) times daily. 01/18/21   Margarette Canada, NP  Norethindrone Acetate-Ethinyl Estrad-FE (LOESTRIN 24 FE) 1-20 MG-MCG(24) tablet Take 1 tablet by mouth daily. 04/02/20 04/02/21  [provider]  oseltamivir (TAMIFLU) 75 MG capsule Take 75 mg by mouth 2 (two) times daily. 01/17/21   [provider]  promethazine-dextromethorphan (PROMETHAZINE-DM) 6.25-15 MG/5ML syrup Take 5 mLs by mouth 4 (four) times daily as needed.  01/18/21   Margarette Canada, NP    Allergies Patient has no known allergies.  No family history on file.  Social History Social History   Tobacco Use   Smoking status: Never   Smokeless tobacco: Never  Substance Use Topics   Alcohol use: Not Currently   Drug use: Not Currently    Review of Systems  Constitutional: No fever/chills Eyes: No visual changes. ENT: No sore throat. Cardiovascular: Denies chest pain. Respiratory: Denies shortness of breath. Gastrointestinal: No abdominal pain.  No nausea, no vomiting.  No diarrhea.  No constipation. Genitourinary: Negative for dysuria. Musculoskeletal: Negative for back pain. Skin: Negative for rash. Neurological: Positive for headaches, focal weakness and numbness.  ____________________________________________   PHYSICAL EXAM:  VITAL SIGNS: Vitals:   02/13/21 1345 02/13/21 1400  BP: 114/74 120/82  Pulse: 68 63  Resp: 16   Temp:    SpO2: 100% 100%      Constitutional: Alert and oriented. Well appearing and in no acute distress. Preferring to keep a washcloth over her left-sided face and eye due to discomfort associated with blurry vision from the left Eyes: Conjunctivae are normal. PERRL. EOMI. Head: Atraumatic. Nose: No congestion/rhinnorhea. Mouth/Throat: Mucous membranes are moist.  Oropharynx non-erythematous. Neck: No stridor. No cervical spine tenderness to palpation. Cardiovascular: Normal rate, regular rhythm. Grossly normal heart sounds.  Good peripheral circulation. Respiratory: Normal respiratory effort.  No retractions. Lungs CTAB. Gastrointestinal: Soft , nondistended, nontender to palpation. No CVA tenderness. Musculoskeletal: No lower extremity tenderness nor edema.  No joint effusions. No signs of acute trauma. No signs of  trauma to the back or spinal step-offs. Neurologic:  Normal speech and language.  Mild weakness to the left sided extremities compared to the right.  4/5.  Full strength on the right.   Paresthesias, numbness and sensation differential on the left-sided face compared to the right. Some gait instability and dragging of her left foot with assisted walking Skin:  Skin is warm, dry and intact. No rash noted. Psychiatric: Mood and affect are normal. Speech and behavior are normal. ____________________________________________   LABS (all labs ordered are listed, but only abnormal results are displayed)  Labs Reviewed  CBC - Abnormal; Notable for the following components:      Result Value   Hemoglobin 9.5 (*)    HCT 31.3 (*)    MCV 71.1 (*)    MCH 21.6 (*)    RDW 19.4 (*)    All other components within normal limits  COMPREHENSIVE METABOLIC PANEL - Abnormal; Notable for the following components:   Glucose, Bld 103 (*)    All other components within normal limits  PROTIME-INR  APTT  DIFFERENTIAL  CBG MONITORING, ED  POC URINE PREG, ED   ____________________________________________  12 Lead EKG  Sinus rhythm, rate of 67 bpm.  Normal axis and intervals.  No evidence of acute ischemia. ____________________________________________  RADIOLOGY  ED MD interpretation: CT head reviewed by me without evidence of acute intracranial pathology.  Official radiology report(s): CT HEAD CODE STROKE WO CONTRAST  Result Date: 02/13/2021 CLINICAL DATA:  Code stroke. Left-sided weakness in the arm and leg starting at 5:30 a.m. EXAM: CT HEAD WITHOUT CONTRAST TECHNIQUE: Contiguous axial images were obtained from the base of the skull through the vertex without intravenous contrast. COMPARISON:  None. FINDINGS: Brain: No evidence of acute infarction, hemorrhage, hydrocephalus, extra-axial collection or mass lesion/mass effect. Vascular: No hyperdense vessel or unexpected calcification. Skull: Normal. Negative for fracture or focal lesion. Sinuses/Orbits: No acute finding. Other: These results were communicated to Dr. Curly Shores at 10:51 am on 02/13/2021 by text page via the [facility] messaging  system. ASPECTS [facility] Stroke Program Early CT Score) - Ganglionic level infarction (caudate, lentiform nuclei, internal capsule, insula, M1-M3 cortex): 7 - Supraganglionic infarction (M4-M6 cortex): 3 Total score (0-10 with 10 being normal): 10 IMPRESSION: 1. Negative head CT. 2. ASPECTS is  10. Electronically Signed   By: Jorje Guild M.D.   On: 02/13/2021 10:52   CT ANGIO HEAD NECK W WO CM W PERF (CODE STROKE)  Result Date: 02/13/2021 CLINICAL DATA:  Acute stroke suspected EXAM: CT ANGIOGRAPHY HEAD AND NECK TECHNIQUE: Multidetector CT imaging of the head and neck was performed using the standard protocol during bolus administration of intravenous contrast. Multiplanar CT image reconstructions and MIPs were obtained to evaluate the vascular anatomy. Carotid stenosis measurements (when applicable) are obtained utilizing NASCET criteria, using the distal internal carotid diameter as the denominator. CONTRAST:  173mL OMNIPAQUE IOHEXOL 350 MG/ML SOLN COMPARISON:  Noncontrast head CT from earlier today. FINDINGS: CTA NECK FINDINGS Aortic arch: Unremarkable Right carotid system: Vessels are smooth and widely patent. 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 Skeleton: Unremarkable Other neck: No acute finding Upper chest: Negative Review of the MIP images confirms the above findings CTA HEAD FINDINGS Anterior circulation: Vessels are smooth and widely patent. Negative for aneurysm. Posterior circulation: Vessels are smooth and widely patent. Negative for aneurysm.  Venous sinuses: Diffusely patent Anatomic variants: Hypoplastic right A1 segment Review of the MIP images confirms the above findings IMPRESSION: No emergent finding or explanation for symptoms. Electronically Signed   By: Jorje Guild M.D.   On: 02/13/2021 11:22     ____________________________________________   PROCEDURES and INTERVENTIONS  Procedure(s) performed (including Critical Care):  .1-3 Lead EKG Interpretation Performed by: Vladimir Crofts, MD Authorized by: Vladimir Crofts, MD     Interpretation: normal     ECG rate:  70   ECG rate assessment: normal     Rhythm: sinus rhythm     Ectopy: none     Conduction: normal   .Critical Care Performed by: Vladimir Crofts, MD Authorized by: Vladimir Crofts, MD   Critical care provider statement:    Critical care time (minutes):  30   Critical care time was exclusive of:  Separately billable procedures and treating other patients   Critical care was necessary to treat or prevent imminent or life-threatening deterioration of the following conditions:  CNS failure or compromise   Critical care was time spent personally by me on the following activities:  Development of treatment plan with patient or surrogate, discussions with consultants, evaluation of patient's response to treatment, examination of patient, ordering and review of laboratory studies, ordering and review of radiographic studies, ordering and performing treatments and interventions, pulse oximetry, re-evaluation of patient's condition and review of old charts  Medications  sodium chloride flush (NS) 0.9 % injection 3 mL (0 mLs Intravenous Hold 02/13/21 1134)  iohexol (OMNIPAQUE) 350 MG/ML injection 100 mL (100 mLs Intravenous Contrast Given 02/13/21 1116)  ALPRAZolam (XANAX) tablet 2 mg (2 mg Oral Given 02/13/21 1146)    ____________________________________________   MDM / ED COURSE   45 year old female presents to the ED with acute left-sided weakness and vision changes, triggering code stroke protocols.  No evidence of bleed, mass or visible stroke on CT head.  She does have mild weakness to left-sided extremities, left-sided facial sensorium changes and left-sided visual field cut.  Not a candidate for tPA due to timeframe of her  symptoms.  Evaluated by neurology who recommends MRIs of her brain, cervical and thoracic spine to assess for inflammatory conditions such as MS.  Patient signed out to oncoming provider to follow-up on the studies and finalize disposition in conjunction with neurology.  Clinical Course as of 02/13/21 1440  Sun Feb 13, 2021  1133 Discuss plan of care with neurology and patient. MRI brain, C and T spine w/wo contrast. Possibly LP after that [DS]    Clinical Course User Index [DS] Vladimir Crofts, MD    ____________________________________________   FINAL CLINICAL IMPRESSION(S) / ED DIAGNOSES  Final diagnoses:  None     ED Discharge Orders     None        Brenda Savage   Note:  This document was prepared using Dragon voice recognition software and may include unintentional dictation errors.    Vladimir Crofts, MD 02/13/21 [phone]

## 2021-02-14 ENCOUNTER — Observation Stay (HOSPITAL_COMMUNITY)
Admit: 2021-02-14 | Discharge: 2021-02-14 | Disposition: A | Discharge status: Home / Self Care | Payer: 59 | Attending: Internal Medicine | Admitting: Internal Medicine

## 2021-02-14 ENCOUNTER — Observation Stay: Admit: 2021-02-14 | Payer: 59

## 2021-02-14 DIAGNOSIS — R202 Paresthesia of skin: Secondary | ICD-10-CM | POA: Diagnosis present

## 2021-02-14 DIAGNOSIS — R0602 Shortness of breath: Secondary | ICD-10-CM | POA: Diagnosis not present

## 2021-02-14 DIAGNOSIS — R531 Weakness: Secondary | ICD-10-CM | POA: Diagnosis present

## 2021-02-14 DIAGNOSIS — R079 Chest pain, unspecified: Secondary | ICD-10-CM

## 2021-02-14 DIAGNOSIS — Z79899 Other long term (current) drug therapy: Secondary | ICD-10-CM | POA: Diagnosis not present

## 2021-02-14 DIAGNOSIS — D329 Benign neoplasm of meninges, unspecified: Secondary | ICD-10-CM | POA: Diagnosis present

## 2021-02-14 DIAGNOSIS — R000 Tachycardia, unspecified: Secondary | ICD-10-CM | POA: Diagnosis present

## 2021-02-14 DIAGNOSIS — G2581 Restless legs syndrome: Secondary | ICD-10-CM | POA: Diagnosis present

## 2021-02-14 DIAGNOSIS — J069 Acute upper respiratory infection, unspecified: Secondary | ICD-10-CM | POA: Diagnosis present

## 2021-02-14 DIAGNOSIS — F419 Anxiety disorder, unspecified: Secondary | ICD-10-CM | POA: Diagnosis present

## 2021-02-14 DIAGNOSIS — R739 Hyperglycemia, unspecified: Secondary | ICD-10-CM | POA: Diagnosis present

## 2021-02-14 DIAGNOSIS — E049 Nontoxic goiter, unspecified: Secondary | ICD-10-CM | POA: Diagnosis present

## 2021-02-14 DIAGNOSIS — Z793 Long term (current) use of hormonal contraceptives: Secondary | ICD-10-CM | POA: Diagnosis not present

## 2021-02-14 DIAGNOSIS — D509 Iron deficiency anemia, unspecified: Secondary | ICD-10-CM | POA: Diagnosis present

## 2021-02-14 DIAGNOSIS — R7982 Elevated C-reactive protein (CRP): Secondary | ICD-10-CM | POA: Diagnosis present

## 2021-02-14 DIAGNOSIS — Z20822 Contact with and (suspected) exposure to covid-19: Secondary | ICD-10-CM | POA: Diagnosis present

## 2021-02-14 LAB — RESP PANEL BY RT-PCR (FLU A&B, COVID) ARPGX2
Influenza A by PCR: NEGATIVE
Influenza B by PCR: NEGATIVE
SARS Coronavirus 2 by RT PCR: NEGATIVE

## 2021-02-14 LAB — ECHOCARDIOGRAM COMPLETE
AR max vel: 2.41 cm2
AV Area VTI: 2.37 cm2
AV Area mean vel: 2.44 cm2
AV Mean grad: 5 mmHg
AV Peak grad: 9.1 mmHg
Ao pk vel: 1.51 m/s
Area-P 1/2: 3.58 cm2
Height: 65.5 in
MV VTI: 3.46 cm2
S' Lateral: 3.09 cm
Weight: 2848 oz

## 2021-02-14 LAB — LIPID PANEL
Cholesterol: 222 mg/dL — ABNORMAL HIGH (ref 0–200)
HDL: 73 mg/dL (ref >40)
LDL Cholesterol: 126 mg/dL — ABNORMAL HIGH (ref 0–99)
Total CHOL/HDL Ratio: 3 RATIO
Triglycerides: 117 mg/dL (ref <150)
VLDL: 23 mg/dL (ref 0–40)

## 2021-02-14 LAB — D-DIMER, QUANTITATIVE: D-Dimer, Quant: 0.61 ug/mL-FEU — ABNORMAL HIGH (ref 0.00–0.50)

## 2021-02-14 LAB — CSF CELL COUNT WITH DIFFERENTIAL
Eosinophils, CSF: 0 %
Lymphs, CSF: 98 %
Monocyte-Macrophage-Spinal Fluid: 2 %
RBC Count, CSF: 5 /mm3 — ABNORMAL HIGH (ref 0–3)
Segmented Neutrophils-CSF: 0 %
Tube #: 3
WBC, CSF: 8 /mm3 — ABNORMAL HIGH (ref 0–5)

## 2021-02-14 LAB — IRON AND TIBC
Iron: 49 ug/dL (ref 28–170)
Saturation Ratios: 10 % — ABNORMAL LOW (ref 10.4–31.8)
TIBC: 486 ug/dL — ABNORMAL HIGH (ref 250–450)
UIBC: 437 ug/dL

## 2021-02-14 LAB — RETICULOCYTES
Immature Retic Fract: 18.5 % — ABNORMAL HIGH (ref 2.3–15.9)
RBC.: 4.45 MIL/uL (ref 3.87–5.11)
Retic Count, Absolute: 40.9 10*3/uL (ref 19.0–186.0)
Retic Ct Pct: 0.9 % (ref 0.4–3.1)

## 2021-02-14 LAB — FERRITIN: Ferritin: 4 ng/mL — ABNORMAL LOW (ref 11–307)

## 2021-02-14 LAB — HEMOGLOBIN A1C
Hgb A1c MFr Bld: 5.9 % — ABNORMAL HIGH (ref 4.8–5.6)
Mean Plasma Glucose: 123 mg/dL

## 2021-02-14 LAB — VITAMIN B12: Vitamin B-12: 316 pg/mL (ref 180–914)

## 2021-02-14 LAB — PROTEIN AND GLUCOSE, CSF
Glucose, CSF: 68 mg/dL (ref 40–70)
Total  Protein, CSF: 32 mg/dL (ref 15–45)

## 2021-02-14 LAB — FOLATE: Folate: 14.4 ng/mL (ref >5.9)

## 2021-02-14 LAB — HIV ANTIBODY (ROUTINE TESTING W REFLEX): HIV Screen 4th Generation wRfx: NONREACTIVE

## 2021-02-14 LAB — T4, FREE: Free T4: 0.64 ng/dL (ref 0.61–1.12)

## 2021-02-14 LAB — TSH: TSH: 1.864 u[IU]/mL (ref 0.350–4.500)

## 2021-02-14 MED ORDER — ALPRAZOLAM 1 MG PO TABS
1.0000 mg | ORAL_TABLET | Freq: Three times a day (TID) | ORAL | Status: DC | PRN
  Administered 2021-02-14: 22:00:00 1 mg via ORAL
  Filled 2021-02-14: qty 1

## 2021-02-14 MED ORDER — BENZOCAINE 10 % MT GEL
Freq: Four times a day (QID) | OROMUCOSAL | Status: DC | PRN
  Administered 2021-02-14: 1 via OROMUCOSAL
  Filled 2021-02-14 (×2): qty 9

## 2021-02-14 NOTE — ED Notes (Addendum)
Sandwich tray, crackers, PB, and gingerale given. Patient states her s/s are no worse and no better than when presenting to the hospital. Patient is able to move left arm, but has difficulty picking it up without using her right arm to pick it up. Patient is able to lift bottom half of left leg, but unable to get whole leg off of bed. Patient confirms vision to left eye continues to be blurry/"look like there is a sheet in front of it".

## 2021-02-14 NOTE — Progress Notes (Signed)
Eeg done 

## 2021-02-14 NOTE — ED Notes (Signed)
Patient given a snack

## 2021-02-14 NOTE — Progress Notes (Signed)
SLP Cancellation Note  Patient Details Name: Brenda Savage MRN: [id number] DOB: 1975-06-27   Cancelled treatment:       Reason Eval/Treat Not Completed: SLP screened, no needs identified, will sign off (chart reviewed; consulted NSG and pt) Pt denied any difficulty swallowing and is currently on a regular diet; tolerates swallowing pills w/ water per NSG. Pt conversed in conversation w/out expressive/receptive deficits noted; pt denied any speech-language deficits. Speech clear. Pt ordered her Lunch meal w/ SLP w/out difficulty. She c/o vision changes in her Left eye(which was covered). No further skilled ST services indicated as pt appears at her baseline. Pt agreed. NSG to reconsult if any change in status while admitted.      Orinda Kenner, MS, CCC-SLP Speech Language Pathologist Rehab Services [phone] [facility] 02/14/2021, 2:01 PM

## 2021-02-14 NOTE — Plan of Care (Signed)
  Problem: Education: Goal: Knowledge of patient specific risk factors will improve (INDIVIDUALIZE FOR PATIENT) Outcome: Progressing   Problem: Self-Care: Goal: Verbalization of feelings and concerns over difficulty with self-care will improve Outcome: Progressing Goal: Ability to communicate needs accurately will improve Outcome: Progressing

## 2021-02-14 NOTE — Progress Notes (Signed)
*  PRELIMINARY RESULTS* Echocardiogram 2D Echocardiogram has been performed.  Brenda Savage 02/14/2021, 11:39 AM

## 2021-02-14 NOTE — ED Notes (Signed)
Patient verbalized feeling like "I'm about to crawl out of my skin.". Patient had one time dose of Ativan ordered earlier, but was not given d/t patient being asleep. Ativan given as ordered for patient's complaint.

## 2021-02-14 NOTE — Procedures (Signed)
History: 45 yo F with left sided weakness  Sedation: NOne  Technique: This EEG was acquired with electrodes placed according to the International 10-20 electrode system (including Fp1, Fp2, F3, F4, C3, C4, P3, P4, O1, O2, T3, T4, T5, T6, A1, A2, Fz, Cz, Pz). The following electrodes were missing or displaced: none.   Background: The background consists of intermixed alpha and beta activities. There is a well defined posterior dominant rhythm of 9 Hz that attenuates with eye opening. Sleep is recorded with normal appearing structures.   Photic stimulation: Physiologic driving is not performed.   EEG Abnormalities: None  Clinical Interpretation: This normal EEG is recorded in the waking and sleep state. There was no seizure or seizure predisposition recorded on this study. Please note that lack of epileptiform activity on EEG does not preclude the possibility of epilepsy.   Roland Rack, MD [facility] [phone]  If 7pm- 7am, please page neurology on call as listed in [place].

## 2021-02-14 NOTE — Progress Notes (Signed)
Subjective: No significant changes  Exam: Vitals:   02/14/21 0500 02/14/21 0800  BP: 111/75 104/67  Pulse: 85 88  Resp: 16 16  Temp:  98 F (36.7 C)  SpO2: 100% 98%   Gen: In bed, NAD Resp: non-labored breathing, no acute distress Abd: soft, nt  Neuro: MS: Awake, alert  CN: Pupils equal round and reactive, fundi are visualized with sharp disc margins, no afferent pupillary defect, face symmetric, of note, she splits midline to both vibration and pinprick on the face Motor: She has giveaway weakness of the left upper extremity, downward drift without pronation that then when retested is not present. Sensory: Diminished on the left   Pertinent Labs: TSH wnl   Impression: 45 yo F with left sided weakness/numbness that was abrupt in onset. With persistent symptoms with negative MRI, I think that cerebral ischemia is no longer a real concern.  Her exam is most consistent with a functional nature, but the presence of what is likely a meningioma over the right convexity is concerning.  Given the new onset, I do think that spinal fluid sampling is reasonable, because if this was an inflammatory process then I would expect to see evidence of inflammation of the spinal fluid.  If CSF is negative, then I am not sure I would pursue this any further.  That her current exam is functional, given the report of symptoms abrupt in onset, I do think an EEG would be reasonable.  With no loss of consciousness or other seizure-like activity, however, no clear evidence of seizure.  If EEG were negative, would pursue this.  At this time, we will await lumbar puncture and EEG results, but if these are negative then I would encourage recovery, consider working with PT/OT, I discussed that stress could be playing a role in her current symptoms(she endorses extreme stress).  Recommendations: 1) LP for cells, glucose, protein, culture, IgG index and oligoclonal bands 2) EEG 3) can discontinue  aspirin  Roland Rack, MD [facility] [phone]  If 7pm- 7am, please page neurology on call as listed in [place].

## 2021-02-14 NOTE — Evaluation (Signed)
Physical Therapy Evaluation Patient Details Name: Brenda Savage MRN: [id number] DOB: 12-17-1975 Today's Date: 02/14/2021  History of Present Illness  Brenda Savage is a 45 y.o. 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 PResented to ED on 11/27 for blurred vision in L eye and L sided weakness in UE's and LE's. MRI revelaing meningioma in R parietal lobe.   Clinical Impression  Pt admitted with above diagnosis. Pt received supine in bed agreeable to PT services. Pt reports PLOF, home lay out, assist/DME available at D/c. Maintaining eye patch on L eye due to blurred vision. Reporting headache at 6/10 NPS and tooth ache 8/10. RN aware. RN present initially in room for 10 min to assist pt in SPT to [facility] for toileting. Pt able to SPT to [facility] with supervision. PT left room for pt privacy per request for toileting (~10 min). Pt presenting with LUE/LLE decreased sensation and weakness compared to RUE/LE. See below for MMT scores. Ultimately requires minguard to minA for bed mobility and minguard for transfers and amb. Pt displaying ability to grip RW with L hand but does require RUE to place LUE on RW. Pt also indep with set up of LLE in prep for standing. With minguard and VC's pt able to stand to RW and progress from step to pattern to step through pattern. Pt required min VC"s for improving gait mechanics and RW sequencing with good carryover after cues. Initially required sliding L foot to progress in gait but able to ultimately rely on hip flexion to improve stance phase of gait to a step through pattern but is very minimal in foot clearance. Pt retruned to bed requiring minA at LE's due to height of bed in ED but educated on how to compensate elevating L foot into bed with RLE instead of relying on UE support. Educated on LLE exercises in gravity reduced positions and gravity dependent positions to improve ankle, knee, and, hip  strength. Rec HH PT due to balance, amb, strength, and gait deficits. Pt currently with functional limitations due to the deficits listed below (see PT Problem List). Pt will benefit from skilled PT to increase their independence and safety with mobility to allow discharge to the venue listed below.      Recommendations for follow up therapy are one component of a multi-disciplinary discharge planning process, led by the attending physician.  Recommendations may be updated based on patient status, additional functional criteria and insurance authorization.  Follow Up Recommendations Home health PT (once HH is complete would benefit from OP PT)    Assistance Recommended at Discharge Intermittent Supervision/Assistance  Functional Status Assessment Patient has had a recent decline in their functional status and demonstrates the ability to make significant improvements in function in a reasonable and predictable amount of time.   Equipment Recommendations  Rolling walker (2 wheels);BSC/3in1    Recommendations for Other Services       Precautions / Restrictions        Mobility  Bed Mobility Overal bed mobility: Needs Assistance Bed Mobility: Supine to Sit;Sit to Supine     Supine to sit: Min guard;HOB elevated Sit to supine: Min assist   General bed mobility comments: Minimal assist for LE mgmt due to bed height in ED Patient Response: Cooperative  Transfers Overall transfer level: Needs assistance Equipment used: Rolling walker (2 wheels) Transfers: Sit to/from Stand Sit to Stand: Min guard  General transfer comment: Pt able to indep set up L foot in prep for standing    Ambulation/Gait Ambulation/Gait assistance: Min guard Gait Distance (Feet): 40 Feet Assistive device: Rolling walker (2 wheels) Gait Pattern/deviations: Step-to pattern;Step-through pattern;Decreased step length - right;Decreased dorsiflexion - left;Decreased weight shift to left        General Gait Details: Initially step to pattern with limited foot clearance requiring sliding of foot on ground. As pt progresses, pt able to perform step through pattern with limited foot clearance relying on hip flexion. Increased reliance on RW for UE support  Stairs            Wheelchair Mobility    Modified Rankin (Stroke Patients Only)       Balance Overall balance assessment: Needs assistance Sitting-balance support: No upper extremity supported;Feet supported Sitting balance-Leahy Scale: Good Sitting balance - Comments: able to reach within BOS   Standing balance support: Bilateral upper extremity supported;During functional activity;Reliant on assistive device for balance Standing balance-Leahy Scale: Fair Standing balance comment: Reliant on RW                             Pertinent Vitals/Pain Pain Assessment: 0-10 Pain Score: 8  Pain Location: tooth ache and headache which is recorded at 6/10 Pain Descriptors / Indicators: Headache Pain Intervention(s): Monitored during session    Home Living Family/patient expects to be discharged to:: Private residence Living Arrangements: Spouse/significant other;Children Available Help at Discharge: Family;Available 24 hours/day Type of Home: House Home Access: Stairs to enter   [employer] of Steps: 3 at back with L railing, 1 stair at front with no rails   Home Layout: One level Home Equipment: None;Grab bars - tub/shower      Prior Function Prior Level of Function : Independent/Modified Independent             Mobility Comments: works as Market researcher   Dominant Hand: Right    Extremity/Trunk Assessment   Upper Extremity Assessment Upper Extremity Assessment: Generalized weakness;LUE deficits/detail LUE Deficits / Details: Wrist strength in all planes at least 3/5, Elbow pro/sup 3/5, flex/ext 2/5. Requires assist of RUE for shoulder mobility LUE Sensation:  decreased light touch    Lower Extremity Assessment Lower Extremity Assessment: Generalized weakness;LLE deficits/detail LLE Deficits / Details: ankle DF 2/5, knee extension 2/5, hipflexion (SLR) 3/5 LLE Sensation: decreased light touch    Cervical / Trunk Assessment Cervical / Trunk Assessment: Normal  Communication   Communication: No difficulties  Cognition Arousal/Alertness: Awake/alert Behavior During Therapy: WFL for tasks assessed/performed Overall Cognitive Status: Within Functional Limits for tasks assessed                                 General Comments: Clear and appropriate conversation, able to answer all questions appropriately.        General Comments General comments (skin integrity, edema, etc.): HR elevated to mid 130's BPM with mobility, SPO2 remained > 95% on RA    Exercises Other Exercises Other Exercises: Role of PT in acute setting, d/c recs, LE exercises, safe use of DME   Assessment/Plan    PT Assessment Patient needs continued PT services  PT Problem List Decreased strength;Decreased mobility;Decreased safety awareness;Decreased range of motion;Decreased activity tolerance;Decreased balance;Decreased knowledge of use of DME;Pain;Impaired sensation       PT Treatment Interventions DME  instruction;Therapeutic exercise;Gait training;Balance training;Stair training;Neuromuscular re-education;Functional mobility training;Therapeutic activities;Patient/family education    PT Goals (Current goals can be found in the Care Plan section)  Acute Rehab PT Goals Patient Stated Goal: to get home to her family PT Goal Formulation: With patient Time For Goal Achievement: 02/28/21 Potential to Achieve Goals: Good    Frequency 7X/week   Barriers to discharge   stairs    Co-evaluation               AM-PAC PT "6 Clicks" Mobility  Outcome Measure Help needed turning from your back to your side while in a flat bed without using  bedrails?: A Little Help needed moving from lying on your back to sitting on the side of a flat bed without using bedrails?: A Little Help needed moving to and from a bed to a chair (including a wheelchair)?: A Little Help needed standing up from a chair using your arms (e.g., wheelchair or bedside chair)?: A Little Help needed to walk in hospital room?: A Little Help needed climbing 3-5 steps with a railing? : A Lot 6 Click Score: 17    End of Session Equipment Utilized During Treatment: Gait belt Activity Tolerance: Patient tolerated treatment well Patient left: in bed Nurse Communication: Mobility status PT Visit Diagnosis: Unsteadiness on feet (R26.81);Hemiplegia and hemiparesis;Other abnormalities of gait and mobility (R26.89);Muscle weakness (generalized) (M62.81) Hemiplegia - Right/Left: Left Hemiplegia - dominant/non-dominant: Non-dominant Hemiplegia - caused by: Unspecified    Time: 9417-4081 PT Time Calculation (min) (ACUTE ONLY): 46 min   Charges:   PT Evaluation $PT Eval Moderate Complexity: 1 Mod PT Treatments $Gait Training: 8-22 mins $Therapeutic Exercise: 8-22 mins       Sherah Lund M. Fairly IV, PT, DPT Physical Therapist- [facility]  02/14/2021, 11:11 AM

## 2021-02-14 NOTE — Procedures (Signed)
Indication: Abnormal MRI  Risks of the procedure were dicussed with the patient including post-LP headache, bleeding, infection, weakness/numbness of legs(radiculopathy), death.  The patient agreed and written consent was obtained.   The patient was prepped and draped in the seated position, and using sterile technique a 20 gauge quinke spinal needle was inserted in the L4-5 on the third pass. No complications were encountered. Approximately 15 cc of CSF were obtained and sent for analysis.   Roland Rack, MD [facility] [phone]  If 7pm- 7am, please page neurology on call as listed in [place].

## 2021-02-14 NOTE — ED Notes (Signed)
Per report received, previous nurse completed stroke swallow screen and pt passed without difficulty.

## 2021-02-14 NOTE — Evaluation (Signed)
Occupational Therapy Evaluation Patient Details Name: Mammie Meras MRN: [id number] DOB: [birth year] Today's Date: 02/14/2021   History of Present Illness Shawneequa Baldridge is a 45 y.o. 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 PResented to ED on 11/27 for blurred vision in L eye and L sided weakness in UE's and LE's. MRI revelaing meningioma in R parietal lobe.   Clinical Impression   Pt seen for OT evaluation this date. Prior to admission, pt was working as an Therapist, sports, driving, and living in a 1-story home with husband and children. Pt currently presents with diplopia (disappears with L eye covered), decreased balance, and decreased LUE sensation, strength, coordination, and proprioception. Due to these current functional impairments, pt requires increased time/effort for seated grooming tasks (requires only set-up assist), MIN GUARD for functional mobility of short household distances with RW, and MIN GUARD for toilet transfers/clothing manipulation and hygiene. Pt would benefit from additional skilled OT services to address to above impairments and ultimately maximize independence with functional tasks/mobility. Upon discharge, recommend HHOT services (once [facility] is complete would benefit from outpatient OT).         Recommendations for follow up therapy are one component of a multi-disciplinary discharge planning process, led by the attending physician.  Recommendations may be updated based on patient status, additional functional criteria and insurance authorization.   Follow Up Recommendations  Home health OT    Assistance Recommended at Discharge Set up Supervision/Assistance  Functional Status Assessment  Patient has had a recent decline in their functional status and demonstrates the ability to make significant improvements in function in a reasonable and predictable amount of time.  Equipment Recommendations  Other  (comment) (2ww. Pt has access to 3-in-1 BSC\)       Precautions / Restrictions Precautions Precautions: None (low fall risk) Restrictions Weight Bearing Restrictions: No      Mobility Bed Mobility Overal bed mobility: Needs Assistance Bed Mobility: Supine to Sit;Sit to Supine     Supine to sit: Min guard;HOB elevated Sit to supine: Min assist   General bed mobility comments: Minimal assist for LE mgmt due to bed height in ED    Transfers Overall transfer level: Needs assistance Equipment used: Rolling walker (2 wheels) Transfers: Sit to/from Stand Sit to Stand: Min guard                  Balance Overall balance assessment: Needs assistance Sitting-balance support: No upper extremity supported;Feet supported Sitting balance-Leahy Scale: Good Sitting balance - Comments: able to reach within BOS   Standing balance support: Bilateral upper extremity supported;During functional activity;Reliant on assistive device for balance Standing balance-Leahy Scale: Fair Standing balance comment: Reliant on RW                           ADL either performed or assessed with clinical judgement   ADL Overall ADL's : Needs assistance/impaired     Grooming: Wash/dry hands;Set up;Sitting                   Toilet Transfer: Min guard;Ambulation;BSC/3in1;Rolling walker (2 wheels)   Toileting- Clothing Manipulation and Hygiene: Min guard;Sit to/from stand Toileting - Clothing Manipulation Details (indicate cue type and reason): to don/doff pants and underwear and perform peri-care. Some difficulty noted managing packaging of wipes     Functional mobility during ADLs: Min guard;Rolling walker (2 wheels)  Vision Baseline Vision/History: 0 No visual deficits Ability to See in Adequate Light: 1 Impaired Patient Visual Report: Blurring of vision Vision Assessment?: Yes Eye Alignment: Within Functional Limits Ocular Range of Motion: Within Functional  Limits Tracking/Visual Pursuits: Able to track stimulus in all quads without difficulty Convergence: Within functional limits Diplopia Assessment: Disappears with one eye closed (with L eye closed) Additional Comments: With L eye closed, able to read fine print            Pertinent Vitals/Pain Pain Assessment: 0-10 Pain Score: 6  Pain Location: headache Pain Descriptors / Indicators: Headache Pain Intervention(s): Monitored during session;Premedicated before session     Hand Dominance Right   Extremity/Trunk Assessment Upper Extremity Assessment Upper Extremity Assessment: LUE deficits/detail;RUE deficits/detail RUE Deficits / Details: WFL RUE Sensation: WNL RUE Coordination: WNL LUE Deficits / Details: Shoulder flex/ext 3+/5, elbow flex/ext 3/5, grip 3/5. Compared to RUE, LUE requires increased time/effort to complete palm-to-finger translation, digit opposition, and alternating pronation/supination. LUE Sensation: decreased light touch;decreased proprioception LUE Coordination: decreased fine motor   Lower Extremity Assessment Lower Extremity Assessment: Generalized weakness;Defer to PT evaluation LLE Sensation: decreased light touch   Cervical / Trunk Assessment Cervical / Trunk Assessment: Normal   Communication Communication Communication: No difficulties   Cognition Arousal/Alertness: Awake/alert Behavior During Therapy: WFL for tasks assessed/performed Overall Cognitive Status: Within Functional Limits for tasks assessed                                 General Comments: Clear and appropriate conversation, able to answer all questions appropriately.     General Comments  HR elevated to 142 BPM when donning pants following toileting; RN & MD informed. HR returning to 92bpm once seated    Exercises Other Exercises Other Exercises: Educated pt on role of OT in acute care setting, POC, and d/c rec        [place] expects to be  discharged to:: Private residence Living Arrangements: Spouse/significant other;Children Available Help at Discharge: Family;Available 24 hours/day Type of Home: House Home Access: Stairs to enter [employer] of Steps: 3 at back with L railing, 1 stair at front with no rails   Home Layout: One level     Bathroom Shower/Tub: Teacher, early years/pre: Standard Bathroom Accessibility: Yes   Home Equipment: None   Additional Comments: Has access to 3in1 BSC but does not use      Prior Functioning/Environment Prior Level of Function : Independent/Modified Independent             Mobility Comments: Independent with functional mobility (no AD). No falls ADLs Comments: Pt independent with ADLs/IADLs, driving, and working as an Medical illustrator Problem List: Decreased strength;Decreased range of motion;Impaired balance (sitting and/or standing);Impaired vision/perception;Decreased coordination;Decreased knowledge of use of DME or AE;Impaired sensation;Impaired UE functional use      OT Treatment/Interventions: Self-care/ADL training;Therapeutic exercise;Energy conservation;DME and/or AE instruction;Therapeutic activities;Visual/perceptual remediation/compensation;Patient/family education;Balance training    OT Goals(Current goals can be found in the care plan section) Acute Rehab OT Goals Patient Stated Goal: to regain function in left arm OT Goal Formulation: With patient Time For Goal Achievement: 02/28/21 Potential to Achieve Goals: Fair ADL Goals Pt Will Perform Grooming: with modified independence;standing (while utilizing at least 1 visual compensatory strategy) Pt Will Transfer to Toilet: with modified independence;ambulating;bedside commode Pt/caregiver will Perform Home Exercise Program: Increased strength;Increased ROM;Left upper  extremity;With Supervision;With written HEP provided  OT Frequency: Min 3X/week    AM-PAC OT "6 Clicks" Daily Activity      Outcome Measure Help from another person eating meals?: None Help from another person taking care of personal grooming?: A Little Help from another person toileting, which includes using toliet, bedpan, or urinal?: A Little Help from another person bathing (including washing, rinsing, drying)?: A Little Help from another person to put on and taking off regular upper body clothing?: A Little Help from another person to put on and taking off regular lower body clothing?: A Little 6 Click Score: 19   End of Session Equipment Utilized During Treatment: Rolling walker (2 wheels) Nurse Communication: Mobility status  Activity Tolerance: Patient tolerated treatment well Patient left: in bed;with call bell/phone within reach  OT Visit Diagnosis: Unsteadiness on feet (R26.81);Other symptoms and signs involving the nervous system (R29.898)                Time: 1425-1500 OT Time Calculation (min): 35 min Charges:  OT General Charges $OT Visit: 1 Visit OT Evaluation $OT Eval Moderate Complexity: 1 Mod OT Treatments $Self Care/Home Management : 8-22 mins $Therapeutic Activity: 8-22 mins  Fredirick Maudlin, OTR/L [place]

## 2021-02-14 NOTE — Progress Notes (Signed)
OT Cancellation Note  Patient Details Name: Brenda Savage MRN: [id number] DOB: Jul 19, 1975   Cancelled Treatment:    Reason Eval/Treat Not Completed: Medical issues which prohibited therapy. Upon arrival to room, RN reporting pt s/p spinal tap and now on bedrest orders for 2 hours. OT to follow-up at later time as able.   Fredirick Maudlin, OTR/L [place]

## 2021-02-14 NOTE — Progress Notes (Signed)
PROGRESS NOTE    Brenda Savage  KGM:[id number] DOB: Sep 20, 1975 DOA: 02/13/2021 PCP: Pcp, No   Chief Complaint  Patient presents with   Code Stroke   Brief Narrative/Hospital Course:  Brenda Savage, 45 y.o. female with PMH of  anxiety, anemia due to chronic blood loss with pica, back pain who recovered from flu recently on 01/18/2021 and subsequently had a flu shot approximately 1 week PTA presents to the ED with weakness and numbness on the left side along with blurred vision, facial numbness on the left.   Her symptoms started a week ago felt like there is some subtle sensation changes in the bilateral lower extremities weakness and burning, able to work through this and then subsequently developed some left facial numbness around 2 PM 11/26 and around 5:15 AM 11/27 was having left eye visual blurriness and felt within the left upper extremity.She also had 2 days of neck stiffness with mild headache.  She was seen in the ED, vital stable routine labs showed chronic anemia 9.5 g COVID-19 and influenza negative.  Underwent CT head CT angio head and neck no acute finding, no LVO and subsequently had MRI brain and MRI thoracic and cervical spine- No acute stroke noted-MRI showed right parietal lesion that could explain her left-sided sensory symptoms, focal seizure could be possibility, seen by neurology admitted with plan for LP, EEG Underwent LP EEG echo. Per neurology if those work-up are unremarkable no need to pursue further, could be functional given patient's under a lot of stress recently.    Subjective: Seen this am getting PT. Feels her weakness and numbness is improving. Feels reluctant to go home today  Assessment & Plan:  Left-sided weakness/sensory symptoms stroke work-up unremarkable with MRI brain showing Right parietal lesion:focal seizure could be possibility, seen by neurology admitted  and undergoing LP and EEG, Echo.  If work-up unremarkable no need to pursue further  likely functional, recommendation from neurology is to continue PT OT supportive measures. Will need HH.  Sinus tachycardia on ambulation mostly- encouraged PO intake. Echo unremakable, likely from anemia. Goiter:TSH T4-euthyroid. Shortness of breath?-chest x-ray clear D-dimer stable 0.6 (borderline up). She is not hypoxic not tachycardic.  No suspicion of PE (and also would not subject to IV contrast given she just had a CT angio head and neck with contrast). Monitor.  Chronic anemia, previous h/h 8.7 gm no at 9.  DVT prophylaxis: SCD's Start: 02/13/21 2037 Code Status:   Code Status: Full Code Family Communication: plan of care discussed with patient  at bedside. Status is: admitted as Observation Remains hospitalized for ongoing management of her left sided paraesthesia and further workup Disposition: Currently not  medically stable for discharge. Anticipated Disposition: Home w/ HH   Objective: Vitals last 24 hrs: Vitals:   02/14/21 0500 02/14/21 0800 02/14/21 1207 02/14/21 1415  BP: 111/75 104/67  123/71  Pulse: 85 88  87  Resp: 16 16  18   Temp:  98 F (36.7 C) 98.4 F (36.9 C)   TempSrc:  Oral Oral   SpO2: 100% 98%  99%  Weight:      Height:       Weight change:   Intake/Output Summary (Last 24 hours) at 02/14/2021 1615 Last data filed at 02/14/2021 0900 Gross per 24 hour  Intake 0 ml  Output --  Net 0 ml   Net IO Since Admission: 0 mL [02/14/21 1615]   Physical Examination: General exam: Aa0x3, weak,older than stated age. HEENT:Oral mucosa moist, Ear/Nose WNL  grossly,dentition normal. Respiratory system: B/l clear BS, no use of accessory muscle, non tender. Cardiovascular system: S1 & S2 +,No JVD. Gastrointestinal system: Abdomen soft, NT,ND, BS+. Nervous System:Alert, awake, moving extremities. Extremities: edema none, distal peripheral pulses palpable.  Skin: No rashes, no icterus. MSK: Normal muscle bulk, tone, power.  Medications reviewed:  Scheduled  Meds:   stroke: mapping our early stages of recovery book   Does not apply Once   sodium chloride flush  3 mL Intravenous Once   Continuous Infusions:  sodium chloride 75 mL/hr at 02/14/21 0519   sodium chloride      Diet Order             Diet regular Room service appropriate? Yes; Fluid consistency: Thin  Diet effective now                          Weight change:   Wt Readings from Last 3 Encounters:  02/13/21 80.7 kg  01/18/21 78.5 kg  02/01/20 75.3 kg     Consultants:see note  Procedures:see note Antimicrobials: Anti-infectives (From admission, onward)    None      Culture/Microbiology    Component Value Date/Time   SDES CSF 02/14/2021 1030   SPECREQUEST NONE 02/14/2021 1030   CULT PENDING 02/14/2021 1030   REPTSTATUS PENDING 02/14/2021 1030    Other culture-see note  Unresulted Labs (From admission, onward)     Start     Ordered   02/14/21 1028  IgG CSF index  (IgG CSF Index, CSF + Serum panel)  Once,   STAT        02/14/21 1027   02/14/21 1028  Draw extra clot tube  (IgG CSF Index, CSF + Serum panel)  Once,   STAT        02/14/21 1027   02/14/21 1028  Oligoclonal bands, CSF + serum  (Oligoclonal Bands, CSF + Serum panel)  Once,   STAT        02/14/21 1027   02/14/21 1028  Draw extra clot tube  (Oligoclonal Bands, CSF + Serum panel)  Once,   STAT        02/14/21 1027   02/14/21 0500  Hemoglobin A1c  (Labs)  Tomorrow morning,   STAT        02/13/21 2036          Data Reviewed: I have personally reviewed following labs and imaging studies CBC: Recent Labs  Lab 02/13/21 1049  WBC 4.7  NEUTROABS 1.9  HGB 9.5*  HCT 31.3*  MCV 71.1*  PLT 811   Basic Metabolic Panel: Recent Labs  Lab 02/13/21 1049  NA 136  K 3.8  CL 106  CO2 24  GLUCOSE 103*  BUN 11  CREATININE 0.76  CALCIUM 9.1   GFR: Estimated Creatinine Clearance: 96.2 mL/min (by C-G formula based on SCr of 0.76 mg/dL). Liver Function Tests: Recent Labs  Lab  02/13/21 1049  AST 20  ALT 17  ALKPHOS 54  BILITOT 0.6  PROT 7.4  ALBUMIN 3.7   No results for input(s): LIPASE, AMYLASE in the last 168 hours. No results for input(s): AMMONIA in the last 168 hours. Coagulation Profile: Recent Labs  Lab 02/13/21 1049  INR 1.0   Cardiac Enzymes: No results for input(s): CKTOTAL, CKMB, CKMBINDEX, TROPONINI in the last 168 hours. BNP (last 3 results) No results for input(s): PROBNP in the last 8760 hours. HbA1C: No results for input(s): HGBA1C in the last  72 hours. CBG: Recent Labs  Lab 02/13/21 1034  GLUCAP 91   Lipid Profile: Recent Labs    02/14/21 0632  CHOL 222*  HDL 73  LDLCALC 126*  TRIG 117  CHOLHDL 3.0   Thyroid Function Tests: Recent Labs    02/14/21 0200  TSH 1.864  FREET4 0.64   Anemia Panel: Recent Labs    02/14/21 0200  VITAMINB12 316  FOLATE 14.4  FERRITIN 4*  TIBC 486*  IRON 49  RETICCTPCT 0.9   Sepsis Labs: No results for input(s): PROCALCITON, LATICACIDVEN in the last 168 hours.  Recent Results (from the past 240 hour(s))  Resp Panel by RT-PCR (Flu A&B, Covid) Nasopharyngeal Swab     Status: None   Collection Time: 02/14/21  5:40 AM   Specimen: Nasopharyngeal Swab; Nasopharyngeal(NP) swabs in vial transport medium  Result Value Ref Range Status   SARS Coronavirus 2 by RT PCR NEGATIVE NEGATIVE Final    Comment: (NOTE) SARS-CoV-2 target nucleic acids are NOT DETECTED.  The SARS-CoV-2 RNA is generally detectable in upper respiratory specimens during the acute phase of infection. The lowest concentration of SARS-CoV-2 viral copies this assay can detect is 138 copies/mL. A negative result does not preclude SARS-Cov-2 infection and should not be used as the sole basis for treatment or other patient management decisions. A negative result may occur with  improper specimen collection/handling, submission of specimen other than nasopharyngeal swab, presence of viral mutation(s) within the areas  targeted by this assay, and inadequate number of viral copies(<138 copies/mL). A negative result must be combined with clinical observations, patient history, and epidemiological information. The expected result is Negative.  Fact Sheet for Patients:  EntrepreneurPulse.com.au  Fact Sheet for Healthcare Providers:  IncredibleEmployment.be  This test is no t yet approved or cleared by the [place] FDA and  has been authorized for detection and/or diagnosis of SARS-CoV-2 by FDA under an Emergency Use Authorization (EUA). This EUA will remain  in effect (meaning this test can be used) for the duration of the COVID-19 declaration under Section 564(b)(1) of the Act, 21 U.S.C.section 360bbb-3(b)(1), unless the authorization is terminated  or revoked sooner.       Influenza A by PCR NEGATIVE NEGATIVE Final   Influenza B by PCR NEGATIVE NEGATIVE Final    Comment: (NOTE) The Xpert Xpress SARS-CoV-2/FLU/RSV plus assay is intended as an aid in the diagnosis of influenza from Nasopharyngeal swab specimens and should not be used as a sole basis for treatment. Nasal washings and aspirates are unacceptable for Xpert Xpress SARS-CoV-2/FLU/RSV testing.  Fact Sheet for Patients: EntrepreneurPulse.com.au  Fact Sheet for Healthcare Providers: IncredibleEmployment.be  This test is not yet approved or cleared by the [place] FDA and has been authorized for detection and/or diagnosis of SARS-CoV-2 by FDA under an Emergency Use Authorization (EUA). This EUA will remain in effect (meaning this test can be used) for the duration of the COVID-19 declaration under Section 564(b)(1) of the Act, 21 U.S.C. section 360bbb-3(b)(1), unless the authorization is terminated or revoked.  Performed at [facility], [address] [place], [place] [address]   CSF culture w Stat Gram Stain     Status: None (Preliminary  result)   Collection Time: 02/14/21 10:30 AM   Specimen: CSF; Cerebrospinal Fluid  Result Value Ref Range Status   Specimen Description CSF  Final   Special Requests NONE  Final   Gram Stain   Final    NO ORGANISMS SEEN WBC SEEN NO RBC SEEN  Performed at [facility], [address] [place], [place] [address]    Culture PENDING  Incomplete   Report Status PENDING  Incomplete     Radiology Studies: MR Brain W and Wo Contrast  Result Date: 02/13/2021 CLINICAL DATA:  Left facial weakness, numbness and vision loss. Left-sided weakness and dysmetria. EXAM: MRI HEAD WITHOUT AND WITH CONTRAST TECHNIQUE: Multiplanar, multiecho pulse sequences of the brain and surrounding structures were obtained without and with intravenous contrast. CONTRAST:  7.17mL GADAVIST GADOBUTROL 1 MMOL/ML IV SOLN COMPARISON:  Noncontrast head CT and CT angiogram head/neck 02/13/2021. FINDINGS: Brain: Cerebral volume is normal. 7 x 4 mm dural-based homogeneously enhancing mass overlying the right parietal lobe, likely reflecting a small meningioma. No underlying parenchymal edema. No cortical encephalomalacia is identified. No significant cerebral white matter disease. There is no acute infarct. No definite chronic intracranial blood products. No extra-axial fluid collection. No midline shift. Vascular: Maintained flow voids within the proximal large arterial vessels. Skull and upper cervical spine: Focal suspicious marrow lesion. Sinuses/Orbits: Visualized orbits show no acute finding. Mild mucosal thickening within the bilateral maxillary sinuses. Trace mucosal thickening within the bilateral ethmoid sinuses. IMPRESSION: No evidence of acute infarct. 7 x 4 mm dural-based enhancing mass overlying the right parietal lobe, likely reflecting a small meningioma. No underlying parenchymal edema. Otherwise unremarkable MRI appearance of the brain. Mild bilateral ethmoid and maxillary sinus disease, as described.  Electronically Signed   By: Kellie Simmering D.O.   On: 02/13/2021 17:48   MR Cervical Spine W or Wo Contrast  Result Date: 02/13/2021 CLINICAL DATA:  Left facial weakness with numbness and vision loss. EXAM: MRI CERVICAL AND THORACIC SPINE WITHOUT AND WITH CONTRAST TECHNIQUE: Multiplanar and multiecho pulse sequences of the cervical spine, to include the craniocervical junction and cervicothoracic junction, and the thoracic spine, were obtained without and with intravenous contrast. CONTRAST:  7.85mL GADAVIST GADOBUTROL 1 MMOL/ML IV SOLN COMPARISON:  None. FINDINGS: MRI CERVICAL SPINE FINDINGS Alignment: Physiologic. Vertebrae: No acute or suspicious osseous findings. No abnormal osseous enhancement. Cord: Normal in signal and caliber. Posterior Fossa, vertebral arteries, paraspinal tissues: Visualized portions of the posterior fossa appear unremarkable. No significant paraspinal findings. Bilateral vertebral artery flow voids. Disc levels: No significant disc space findings at C2-3 or C3-4. C4-5: Small central disc protrusion. No cord deformity or foraminal compromise. C5-6: Small right paracentral disc protrusion. No cord deformity or foraminal compromise. C6-7: Tiny right paracentral disc protrusion. No cord deformity or foraminal compromise. C7-T1: Normal interspace. MRI THORACIC SPINE FINDINGS Alignment:  Normal. Vertebrae: No evidence of acute fracture, focal lesion or abnormal enhancement. Cord: The thoracic cord is normal in signal and caliber. Conus medullaris extends to the L1 level. The spinal canal is widely patent. No abnormal intradural enhancement. Paraspinal and other soft tissues: Unremarkable. Disc levels: No significant thoracic disc herniation, spinal stenosis or nerve root encroachment. IMPRESSION: 1. No acute findings or explanation for the patient's symptoms. 2. The cervicothoracic cord appears normal. 3. Small cervical disc protrusions without cord deformity or nerve root encroachment. No  significant thoracic disc space findings. Electronically Signed   By: Richardean Sale M.D.   On: 02/13/2021 17:37   MR THORACIC SPINE W WO CONTRAST  Result Date: 02/13/2021 CLINICAL DATA:  Left facial weakness with numbness and vision loss. EXAM: MRI CERVICAL AND THORACIC SPINE WITHOUT AND WITH CONTRAST TECHNIQUE: Multiplanar and multiecho pulse sequences of the cervical spine, to include the craniocervical junction and cervicothoracic junction, and the thoracic spine, were obtained without  and with intravenous contrast. CONTRAST:  7.53mL GADAVIST GADOBUTROL 1 MMOL/ML IV SOLN COMPARISON:  None. FINDINGS: MRI CERVICAL SPINE FINDINGS Alignment: Physiologic. Vertebrae: No acute or suspicious osseous findings. No abnormal osseous enhancement. Cord: Normal in signal and caliber. Posterior Fossa, vertebral arteries, paraspinal tissues: Visualized portions of the posterior fossa appear unremarkable. No significant paraspinal findings. Bilateral vertebral artery flow voids. Disc levels: No significant disc space findings at C2-3 or C3-4. C4-5: Small central disc protrusion. No cord deformity or foraminal compromise. C5-6: Small right paracentral disc protrusion. No cord deformity or foraminal compromise. C6-7: Tiny right paracentral disc protrusion. No cord deformity or foraminal compromise. C7-T1: Normal interspace. MRI THORACIC SPINE FINDINGS Alignment:  Normal. Vertebrae: No evidence of acute fracture, focal lesion or abnormal enhancement. Cord: The thoracic cord is normal in signal and caliber. Conus medullaris extends to the L1 level. The spinal canal is widely patent. No abnormal intradural enhancement. Paraspinal and other soft tissues: Unremarkable. Disc levels: No significant thoracic disc herniation, spinal stenosis or nerve root encroachment. IMPRESSION: 1. No acute findings or explanation for the patient's symptoms. 2. The cervicothoracic cord appears normal. 3. Small cervical disc protrusions without cord  deformity or nerve root encroachment. No significant thoracic disc space findings. Electronically Signed   By: Richardean Sale M.D.   On: 02/13/2021 17:37   ECHOCARDIOGRAM COMPLETE  Result Date: 02/14/2021    ECHOCARDIOGRAM REPORT   Patient Name:   RONAE NOELL Date of Exam: 02/14/2021 Medical Rec #:  [id number]      Height:       65.5 in Accession #:    [id number]     Weight:       178.0 lb Date of Birth:  10-10-1975      BSA:          1.893 m Patient Age:    29 years       BP:           111/75 mmHg Patient Gender: F              HR:           83 bpm. Exam Location:  [facility] Procedure: 2D Echo, Color Doppler and Cardiac Doppler Indications:     Cerebral vascular accident  History:         Patient has no prior history of Echocardiogram examinations.                  Signs/Symptoms:Shortness of Breath and Left sided weakness.  Sonographer:     Charmayne Sheer Referring Phys:  [place] Diagnosing Phys: Kathlyn Sacramento MD  Sonographer Comments: Suboptimal subcostal window. IMPRESSIONS  1. Left ventricular ejection fraction, by estimation, is 60 to 65%. The left ventricle has normal function. The left ventricle has no regional wall motion abnormalities. Left ventricular diastolic parameters were normal.  2. Right ventricular systolic function is normal. The right ventricular size is normal. Tricuspid regurgitation signal is inadequate for assessing PA pressure.  3. The mitral valve is normal in structure. No evidence of mitral valve regurgitation. No evidence of mitral stenosis.  4. The aortic valve is normal in structure. Aortic valve regurgitation is not visualized. No aortic stenosis is present.  5. The inferior vena cava is normal in size with greater than 50% respiratory variability, suggesting right atrial pressure of 3 mmHg. FINDINGS  Left Ventricle: Left ventricular ejection fraction, by estimation, is 60 to 65%. The left ventricle has normal function. The left ventricle has no regional  wall motion  abnormalities. The left ventricular internal cavity size was normal in size. There is  no left ventricular hypertrophy. Left ventricular diastolic parameters were normal. Right Ventricle: The right ventricular size is normal. No increase in right ventricular wall thickness. Right ventricular systolic function is normal. Tricuspid regurgitation signal is inadequate for assessing PA pressure. Left Atrium: Left atrial size was normal in size. Right Atrium: Right atrial size was normal in size. Pericardium: There is no evidence of pericardial effusion. Mitral Valve: The mitral valve is normal in structure. No evidence of mitral valve regurgitation. No evidence of mitral valve stenosis. MV peak gradient, 2.4 mmHg. The mean mitral valve gradient is 1.0 mmHg. Tricuspid Valve: The tricuspid valve is normal in structure. Tricuspid valve regurgitation is not demonstrated. No evidence of tricuspid stenosis. Aortic Valve: The aortic valve is normal in structure. Aortic valve regurgitation is not visualized. No aortic stenosis is present. Aortic valve mean gradient measures 5.0 mmHg. Aortic valve peak gradient measures 9.1 mmHg. Aortic valve area, by VTI measures 2.37 cm. Pulmonic Valve: The pulmonic valve was normal in structure. Pulmonic valve regurgitation is trivial. No evidence of pulmonic stenosis. Aorta: The aortic root is normal in size and structure. Venous: The inferior vena cava is normal in size with greater than 50% respiratory variability, suggesting right atrial pressure of 3 mmHg. IAS/Shunts: No atrial level shunt detected by color flow Doppler.  LEFT VENTRICLE PLAX 2D LVIDd:         4.72 cm LVIDs:         3.09 cm LV PW:         0.81 cm LV IVS:        0.72 cm LVOT diam:     2.10 cm LV SV:         63 LV SV Index:   33 LVOT Area:     3.46 cm  RIGHT VENTRICLE RV Basal diam:  2.70 cm LEFT ATRIUM             Index        RIGHT ATRIUM          Index LA diam:        3.00 cm 1.58 cm/m   RA Area:     9.66 cm LA Vol  (A2C):   43.3 ml 22.87 ml/m  RA Volume:   16.80 ml 8.87 ml/m LA Vol (A4C):   25.1 ml 13.26 ml/m LA Biplane Vol: 35.2 ml 18.59 ml/m  AORTIC VALVE                     PULMONIC VALVE AV Area (Vmax):    2.41 cm      PV Vmax:       0.91 m/s AV Area (Vmean):   2.44 cm      PV Vmean:      66.300 cm/s AV Area (VTI):     2.37 cm      PV VTI:        0.187 m AV Vmax:           151.00 cm/s   PV Peak grad:  3.3 mmHg AV Vmean:          104.000 cm/s  PV Mean grad:  2.0 mmHg AV VTI:            0.268 m AV Peak Grad:      9.1 mmHg AV Mean Grad:      5.0 mmHg LVOT Vmax:  105.00 cm/s LVOT Vmean:        73.400 cm/s LVOT VTI:          0.183 m LVOT/AV VTI ratio: 0.68  AORTA Ao Root diam: 3.30 cm MITRAL VALVE MV Area (PHT): 3.58 cm    SHUNTS MV Area VTI:   3.46 cm    Systemic VTI:  0.18 m MV Peak grad:  2.4 mmHg    Systemic Diam: 2.10 cm MV Mean grad:  1.0 mmHg MV Vmax:       0.78 m/s MV Vmean:      48.2 cm/s MV Decel Time: 212 msec MV E velocity: 82.30 cm/s MV A velocity: 60.00 cm/s MV E/A ratio:  1.37 Kathlyn Sacramento MD Electronically signed by Kathlyn Sacramento MD Signature Date/Time: 02/14/2021/1:23:23 PM    Final    CT HEAD CODE STROKE WO CONTRAST  Result Date: 02/13/2021 CLINICAL DATA:  Code stroke. Left-sided weakness in the arm and leg starting at 5:30 a.m. EXAM: CT HEAD WITHOUT CONTRAST TECHNIQUE: Contiguous axial images were obtained from the base of the skull through the vertex without intravenous contrast. COMPARISON:  None. FINDINGS: Brain: No evidence of acute infarction, hemorrhage, hydrocephalus, extra-axial collection or mass lesion/mass effect. Vascular: No hyperdense vessel or unexpected calcification. Skull: Normal. Negative for fracture or focal lesion. Sinuses/Orbits: No acute finding. Other: These results were communicated to Dr. Curly Shores at 10:51 am on 02/13/2021 by text page via the [facility] messaging system. ASPECTS [facility] Stroke Program Early CT Score) - Ganglionic level infarction (caudate,  lentiform nuclei, internal capsule, insula, M1-M3 cortex): 7 - Supraganglionic infarction (M4-M6 cortex): 3 Total score (0-10 with 10 being normal): 10 IMPRESSION: 1. Negative head CT. 2. ASPECTS is  10. Electronically Signed   By: Jorje Guild M.D.   On: 02/13/2021 10:52   CT ANGIO HEAD NECK W WO CM W PERF (CODE STROKE)  Result Date: 02/13/2021 CLINICAL DATA:  Acute stroke suspected EXAM: CT ANGIOGRAPHY HEAD AND NECK TECHNIQUE: Multidetector CT imaging of the head and neck was performed using the standard protocol during bolus administration of intravenous contrast. Multiplanar CT image reconstructions and MIPs were obtained to evaluate the vascular anatomy. Carotid stenosis measurements (when applicable) are obtained utilizing NASCET criteria, using the distal internal carotid diameter as the denominator. CONTRAST:  147mL OMNIPAQUE IOHEXOL 350 MG/ML SOLN COMPARISON:  Noncontrast head CT from earlier today. FINDINGS: CTA NECK FINDINGS Aortic arch: Unremarkable Right carotid system: Vessels are smooth and widely patent. 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 Skeleton: Unremarkable Other neck: No acute finding Upper chest: Negative Review of the MIP images confirms the above findings CTA HEAD FINDINGS Anterior circulation: Vessels are smooth and widely patent. Negative for aneurysm. Posterior circulation: Vessels are smooth and widely patent. Negative for aneurysm. Venous sinuses: Diffusely patent Anatomic variants: Hypoplastic right A1 segment Review of the MIP images confirms the above findings IMPRESSION: No emergent finding or explanation for symptoms. Electronically Signed   By: Jorje Guild M.D.   On: 02/13/2021 11:22     LOS: 0 days   Antonieta Pert, MD [facility]  02/14/2021, 4:15 PM

## 2021-02-15 ENCOUNTER — Encounter: Payer: Self-pay | Admitting: Internal Medicine

## 2021-02-15 LAB — IGG CSF INDEX
Albumin CSF-mCnc: 14 mg/dL (ref 8–37)
Albumin: 3.9 g/dL (ref 3.8–4.8)
CSF IgG Index: 0.5 (ref 0.0–0.7)
IgG (Immunoglobin G), Serum: 1442 mg/dL (ref 586–1602)
IgG, CSF: 2.4 mg/dL (ref 0.0–6.7)
IgG/Alb Ratio, CSF: 0.17 (ref 0.00–0.25)

## 2021-02-15 NOTE — Progress Notes (Signed)
Physical Therapy Treatment Patient Details Name: Brenda Savage MRN: [id number] DOB: [birth year] Today's Date: 02/15/2021   History of Present Illness Brenda Savage is a 45 y.o. 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 PResented to ED on 11/27 for blurred vision in L eye and L sided weakness in UE's and LE's. MRI revelaing meningioma in R parietal lobe (likely incidental per neurology); otherwise, unremarkable.  LP unremarkable.    PT Comments    Patient endorses improvement in diplopia; no longer seeing double, "just blurry", but does not appear to impact functional performance.  Continues with weakness to L UE > LE, more predominant with isolated testing/assessment than with functional activities. Despite presentation of limited strength in sitting/supine, able to place and maintain grasp of L UE on RW, able to support body weight in L LE without difficulty; and demonstrates very exaggerated, inconsistent performance/mechanics with functional activities. Neurology in during session and discusses medical work-up with patient, encouraged that all testing has returned unremarkable (feels meningioma incidental and non-contributory to current presentation) and feels that presentation largely non-physiological.  Functional performance during session suggestive of this as well.   Patient receptive of cuing/education during session; did voice understanding of and willingness to participate with HEP to optimize functional return.  Anticipate good functional return/progress throughout rehab course.    Recommendations for follow up therapy are one component of a multi-disciplinary discharge planning process, led by the attending physician.  Recommendations may be updated based on patient status, additional functional criteria and insurance authorization.  Follow Up Recommendations  Home health PT     Assistance Recommended  at Discharge Intermittent Supervision/Assistance  Equipment Recommendations  Rolling walker (2 wheels)    Recommendations for Other Services       Precautions / Restrictions Precautions Precautions: None Restrictions Weight Bearing Restrictions: No     Mobility  Bed Mobility Overal bed mobility: Modified Independent Bed Mobility: Supine to Sit;Sit to Supine           General bed mobility comments: Patient initially using 'hook technique' to self-assist LLE off bed. Encouraged to actively mobilize L LE as much as able to promote optimal functional return (versus use of compensatory strategy)    Transfers Overall transfer level: Needs assistance Equipment used: Rolling walker (2 wheels) Transfers: Sit to/from Stand Sit to Stand: Supervision;Modified independent (Device/Increase time)           General transfer comment: encouraged for symmetrical LE placement and WBing with movement transitions; initially with limited hip flexion/forward trunk lean during stand to sit, but improved with cuing/education    Ambulation/Gait Ambulation/Gait assistance: Supervision Gait Distance (Feet): 250 Feet Assistive device: Rolling walker (2 wheels)         General Gait Details: partially reciprocal stepping pattern with very inconsistent L LE performance (mixed steppage pattern versus sliding across floor versus minimal deficits noted) throughout gait distance.  Tends to count "1-2-3" prior to advancing L LE at times (with goal of "picking up my foot").  Despite inconsistent mechanics, no episodes of buckling or LOB; supports body weight in loading phase with good control, no difficulty.  Encouraged for progression towards continuous stepping pattern, increased cadence.   Stairs Stairs: Yes Stairs assistance: Min assist Stair Management: One rail Left Number of Stairs: 6 General stair comments: step to gait pattern, ascending with R LE, descending with L LE; maintains L UE grasp  on rail and ascends rail without difficulty.  Good  LE control   Wheelchair Mobility    Modified Rankin (Stroke Patients Only)       Balance Overall balance assessment: Needs assistance Sitting-balance support: No upper extremity supported;Feet supported Sitting balance-Leahy Scale: Good     Standing balance support: Bilateral upper extremity supported Standing balance-Leahy Scale: Fair                              Cognition Arousal/Alertness: Awake/alert Behavior During Therapy: WFL for tasks assessed/performed Overall Cognitive Status: Within Functional Limits for tasks assessed                                          Exercises Other Exercises Other Exercises: Toilet transfer, ambulatory with RW, close sup; sit/stand from standard height toilet, close sup/mod indep.  Independently negotiates lower body clothing, pericare (menstrual pad change) without assist from therapist. Other Exercises: Educated in importance of continued efforts at active use/WBing of both L UE/LE with all functional activities; educated in L LE HEP to promote strength/coordination return.  Patient voiced understanding of all infromation.    General Comments        Pertinent Vitals/Pain Pain Assessment: No/denies pain    Home Living                          Prior Function            PT Goals (current goals can now be found in the care plan section) Acute Rehab PT Goals Patient Stated Goal: to get home to her family PT Goal Formulation: With patient Time For Goal Achievement: 02/28/21 Potential to Achieve Goals: Good Progress towards PT goals: Progressing toward goals    Frequency    7X/week      PT Plan Current plan remains appropriate    Co-evaluation              AM-PAC PT "6 Clicks" Mobility   Outcome Measure  Help needed turning from your back to your side while in a flat bed without using bedrails?: None Help needed moving  from lying on your back to sitting on the side of a flat bed without using bedrails?: None Help needed moving to and from a bed to a chair (including a wheelchair)?: None Help needed standing up from a chair using your arms (e.g., wheelchair or bedside chair)?: None Help needed to walk in hospital room?: None Help needed climbing 3-5 steps with a railing? : A Little 6 Click Score: 23    End of Session Equipment Utilized During Treatment: Gait belt Activity Tolerance: Patient tolerated treatment well Patient left: in bed;with bed alarm set;with call bell/phone within reach Nurse Communication: Mobility status PT Visit Diagnosis: Unsteadiness on feet (R26.81);Hemiplegia and hemiparesis;Other abnormalities of gait and mobility (R26.89);Muscle weakness (generalized) (M62.81) Hemiplegia - Right/Left: Left Hemiplegia - dominant/non-dominant: Non-dominant Hemiplegia - caused by: Unspecified     Time: 6734-1937 PT Time Calculation (min) (ACUTE ONLY): 45 min  Charges:  $Gait Training: 8-22 mins $Therapeutic Activity: 8-22 mins $Neuromuscular Re-education: 8-22 mins                    Abbygael Curtiss H. Owens Shark, PT, DPT, NCS 02/15/21, 1:35 PM [phone]

## 2021-02-15 NOTE — TOC Initial Note (Addendum)
Transition of Care [facility]) - Initial/Assessment Note    Patient Details  Name: Brenda Savage MRN: [id number] Date of Birth: [birth year]  Transition [facility]) CM/SW Contact:    Candie Chroman, LCSW Phone Number: 02/15/2021, 1:18 PM  Clinical Narrative:  Patient has orders to discharge home today. Called patient in the room. She is agreeable to home health. PCP is [facility]. [facility], [facility], [facility] [place] [facility], [facility], [facility], and [facility] unable to accept. Latricia Heft and Blanca Friend are checking her insurance. Left message for [facility] representative. Ordered 3-in-1 and RW through [facility]. If unable to find home health patient would be open to outpatient PT.        2:27 pm: [facility] unable to accept. [facility] still running insurance to check. They said patient will likely have high copay.          2:38 pm: [facility] unable to accept referral  Expected Discharge Plan: [place] Barriers to Discharge:  (home health vs outpatient PT)   Patient Goals and CMS Choice        Expected Discharge Plan and Services Expected Discharge Plan: [place], Durable Medical Equipment Living arrangements for the past 2 months: Single Family Home Expected Discharge Date: 02/15/21               DME Arranged: Berta Minor rolling DME Agency: [facility] Date DME Agency Contacted: 02/15/21   Representative spoke with at DME Agency: Suanne Marker            Prior Living Arrangements/Services Living arrangements for the past 2 months: [place] with:: Spouse, Adult Children Patient language and need for interpreter reviewed:: Yes Do you feel safe going back to the place where you live?: Yes      Need for Family Participation in Patient Care: Yes (Comment) Care giver support system in place?: Yes (comment)   Criminal Activity/Legal Involvement Pertinent to Current Situation/Hospitalization: No - Comment  as needed  Activities of Daily Living Home Assistive Devices/Equipment: None ADL Screening (condition at time of admission) Patient's cognitive ability adequate to safely complete daily activities?: Yes Is the patient deaf or have difficulty hearing?: No Does the patient have difficulty seeing, even when wearing glasses/contacts?: Yes Does the patient have difficulty concentrating, remembering, or making decisions?: Yes Patient able to express need for assistance with ADLs?: Yes Does the patient have difficulty dressing or bathing?: Yes Independently performs ADLs?: No Communication: Independent Dressing (OT): Needs assistance Is this a change from baseline?: Change from baseline, expected to last >3 days Grooming: Needs assistance Is this a change from baseline?: Change from baseline, expected to last >3 days Feeding: Independent Bathing: Needs assistance Is this a change from baseline?: Change from baseline, expected to last >3 days Toileting: Needs assistance Is this a change from baseline?: Change from baseline, expected to last >3days In/Out Bed: Needs assistance Is this a change from baseline?: Change from baseline, expected to last >3 days Walks in Home: Needs assistance Is this a change from baseline?: Change from baseline, expected to last >3 days Does the patient have difficulty walking or climbing stairs?: Yes Weakness of Legs: Left Weakness of Arms/Hands: Left  Permission Sought/Granted Permission sought to share information with : Facility Art therapist granted to share information with : Yes, Verbal Permission Granted     Permission granted to share info w AGENCY: [place]  Emotional Assessment Appearance:: Appears stated age Attitude/Demeanor/Rapport: Engaged, Gracious Affect (typically observed): Accepting, Appropriate, Calm, Pleasant Orientation: : Oriented to Self, Oriented to Place, Oriented to  Time, Oriented to  Situation Alcohol / Substance Use: Not Applicable Psych Involvement: No (comment)  Admission diagnosis:  Paresthesia [R20.2] Seizure ([place]) [R56.9] Left-sided weakness [R53.1] Patient Active Problem List   Diagnosis Date Noted   Paresthesia 02/13/2021   Left-sided weakness 02/13/2021   Shortness of breath 02/13/2021   Anemia 02/13/2021   Goiter, 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

## 2021-02-15 NOTE — TOC Transition Note (Signed)
Transition of Care [facility]) - CM/SW Discharge Note   Patient Details  Name: Jazyiah Yiu MRN: [id number] Date of Birth: 08/09/1975  Transition of Care [facility]) CM/SW Contact:  Candie Chroman, LCSW Phone Number: 02/15/2021, 3:36 PM   Clinical Narrative:  Patient has orders to discharge home today. [facility] unable to accept home health referral. DME has been delivered to the room. Patient agreeable to outpatient PT and OT here at [facility]. Referral faxed to them. No further concerns. CSW signing off.   Final next level of care: OP Rehab Barriers to Discharge: No Barriers Identified   Patient Goals and CMS Choice     Choice offered to / list presented to : Patient, Spouse  Discharge Placement                Patient to be transferred to facility by: Husband will take her home Name of family member notified: Magenta Schmiesing Patient and family notified of of transfer: 02/15/21  Discharge Plan and Services     Post Acute Care Choice: Home Health, Durable Medical Equipment          DME Arranged: 3-N-1, Walker rolling DME Agency: [facility] Date DME Agency Contacted: 02/15/21   Representative spoke with at DME Agency: [place] ([place] Interventions     Readmission Risk Interventions No flowsheet data found.

## 2021-02-15 NOTE — Progress Notes (Signed)
Subjective: No significant changes  Exam: Vitals:   02/15/21 0541 02/15/21 0816  BP: (!) 108/93 115/80  Pulse: 76 85  Resp: 18 20  Temp: 98 F (36.7 C) (!) 97.5 F (36.4 C)  SpO2: 100% 100%   Gen: In bed, NAD Resp: non-labored breathing, no acute distress Abd: soft, nt  Neuro: MS: Awake, alert  CN: Pupils equal round and reactive, fundi are visualized with sharp disc margins, no afferent pupillary defect, face symmetric, of note, she splits midline to both vibration and pinprick on the forehead Motor: She has a markedly inconsistent exam, sometimes with drift and sometimes without, sometimes giving good effort and sometimes not. Sensory: Diminished on the left   Impression: 45 yo F with left sided weakness/numbness that was abrupt in onset. With persistent symptoms with negative MRI, I think that cerebral ischemia is no longer a real concern.  Her exam is most consistent with a functional nature, but the presence of what is likely a meningioma over the right convexity was concerning.  Her exam continues to be very functional in nature, with deficits including unilateral vision blurriness that could not be explained by her meningioma.  At this time, with negative EEG, LP and MRI with no other explanation, I think that her deficits are most likely nonphysiological.  She should continue to have serial imaging, and if at a later time she develops cerebral edema, her symptoms are worsening and appear to not be functional, then neurosurgical referral could be considered.  At the present time, however, I would favor working with PT/OT, and follow-up with outpatient neurology for serial imaging.  Recommendations: 1) no further recommendations at this time, please call with further questions or concerns.  Roland Rack, MD [facility] [phone]  If 7pm- 7am, please page neurology on call as listed in [place].

## 2021-02-15 NOTE — Discharge Summary (Signed)
Physician Discharge Summary  Patient ID: Brenda Savage MRN: [id number] DOB/AGE: 1976-01-26 45 y.o.  Admit date: 02/13/2021 Discharge date: 02/15/2021  Admission Diagnoses:  Discharge Diagnoses:  Principal Problem:   Left-sided weakness Active Problems:   Goiter, non-toxic   Paresthesia   Shortness of breath   Anemia   Discharged Condition: stable  Hospital Course: Patient is a 45 year old female with past medical history significant for anxiety, chronic anemia with pica.  She presented on account of acute onset left-sided weakness and numbness.  She has no significant past medical history.  She apparently works at [employer].  She described onset of symptoms started about a week prior to presentation with subtle changes.  Her presentation was initially concerning for possible CVA.  CT scan of the brain came back negative.  MRI was ordered which came back negative for any findings of ischemia.  Neurology recommended EEG.  EEG was negative.  A lumbar puncture was also recommended and came back negative for any findings suggestive of inflammatory causes.  Per neurology consults, head deficits are most consistent with nonphysiological causes.  It is likely, patient's symptoms may be anxiety related.  There was however of an incidental finding of a small meningioma measuring 7 mm x 4 mm.  No obvious surrounding edema were reported.  Recommendations are for patient to receive serial imaging as outpatient and if subsequently developed cerebral edema further treatment may be warranted at the time.  Patient was seen and evaluated by physical therapy and Occupational Therapy.  Intermittent supervision was advised.  Home health PT and OT will also recommended.  Patient will be discharged with same.  An order for wheelchair has been ordered for ambulatory needs.  Fall precaution was highly reemphasized.  Patient agrees to this plan as detailed.  I did discuss thoroughly with neurology regarding  plan of care at discharge.  Consults: neurology  Significant Diagnostic Studies: labs: Spinal tap and radiology: MRI: Brain and CT scan: Brain   FINDINGS: Brain:   Cerebral volume is normal.   7 x 4 mm dural-based homogeneously enhancing mass overlying the right parietal lobe, likely reflecting a small meningioma. No underlying parenchymal edema.   No cortical encephalomalacia is identified. No significant cerebral white matter disease.   There is no acute infarct.   No definite chronic intracranial blood products.   No extra-axial fluid collection.   No midline shift.   Vascular: Maintained flow voids within the proximal large arterial vessels.   Skull and upper cervical spine: Focal suspicious marrow lesion.   Sinuses/Orbits: Visualized orbits show no acute finding. Mild mucosal thickening within the bilateral maxillary sinuses. Trace mucosal thickening within the bilateral ethmoid sinuses.   IMPRESSION: No evidence of acute infarct.   7 x 4 mm dural-based enhancing mass overlying the right parietal lobe, likely reflecting a small meningioma. No underlying parenchymal edema.  Echocardiogram reviewed: Preserved left ventricle ejection fraction.  No obvious abnormal findings.  CTA of the head and neck showed no emergent findings of obvious vascular disease.  CSF report shows glucose of 68, WBC of 8, RBCs of 5, total protein 32.  Treatments: PTOT  Discharge Exam: Blood pressure 119/73, pulse 100, temperature 98.8 F (37.1 C), temperature source Oral, resp. rate 20, height 5' 5.5" (1.664 m), weight 80.7 kg, last menstrual period 02/13/2021, SpO2 100 %. Patient was seen and evaluated in bed.  She looks comfortable.  She is alert oriented x3.  Head is atraumatic.  Neck supple.  Chest is clinically clear.  Abdomen soft nontender.  Extremities without pedal edema.  Neurologic exam was significant for a left upper extremity pronator drift.  Power subjective was 3/5.   Diminished left lower extremity power.  Sensation has improved since yesterday.  For full details of neurologic some kindly referred to neurologist notes.  Disposition: Discharge disposition: 06-Home-Health Care Svc       Discharge Instructions     Call MD for:   Complete by: As directed    Worsening weakness   Call MD for:  extreme fatigue   Complete by: As directed    Diet - low sodium heart healthy   Complete by: As directed    Increase activity slowly   Complete by: As directed       Allergies as of 02/15/2021   No Known Allergies      Medication List     STOP taking these medications    Norethindrone Acetate-Ethinyl Estrad-FE 1-20 MG-MCG(24) tablet Commonly known as: LOESTRIN 24 FE   oseltamivir 75 MG capsule Commonly known as: TAMIFLU   promethazine-dextromethorphan 6.25-15 MG/5ML syrup Commonly known as: PROMETHAZINE-DM       TAKE these medications    benzonatate 100 MG capsule Commonly known as: TESSALON Take 2 capsules (200 mg total) by mouth every 8 (eight) hours.   ipratropium 0.06 % nasal spray Commonly known as: ATROVENT Place 2 sprays into both nostrils 4 (four) times daily.               Durable Medical Equipment  (From admission, onward)           Start     Ordered   02/15/21 1159  DME Walker  Once       Question Answer Comment  Walker: With 5 Inch Wheels   Patient needs a walker to treat with the following condition Acute left-sided weakness      02/15/21 1207             Signed: [name] Brenda Savage 02/15/2021, 12:07 PM

## 2021-02-16 LAB — CYTOLOGY - NON PAP

## 2021-02-17 LAB — OLIGOCLONAL BANDS, CSF + SERM

## 2021-02-18 LAB — CSF CULTURE W GRAM STAIN
Culture: NO GROWTH
Gram Stain: NONE SEEN

## 2021-03-01 ENCOUNTER — Ambulatory Visit: Payer: 59 | Attending: Family Medicine | Admitting: Physical Therapy

## 2021-03-01 ENCOUNTER — Other Ambulatory Visit: Payer: Self-pay

## 2021-03-01 DIAGNOSIS — R2689 Other abnormalities of gait and mobility: Secondary | ICD-10-CM | POA: Insufficient documentation

## 2021-03-01 DIAGNOSIS — R262 Difficulty in walking, not elsewhere classified: Secondary | ICD-10-CM | POA: Diagnosis not present

## 2021-03-01 DIAGNOSIS — R269 Unspecified abnormalities of gait and mobility: Secondary | ICD-10-CM | POA: Diagnosis present

## 2021-03-01 DIAGNOSIS — R278 Other lack of coordination: Secondary | ICD-10-CM | POA: Diagnosis present

## 2021-03-01 DIAGNOSIS — R2681 Unsteadiness on feet: Secondary | ICD-10-CM | POA: Insufficient documentation

## 2021-03-01 DIAGNOSIS — M6281 Muscle weakness (generalized): Secondary | ICD-10-CM | POA: Diagnosis present

## 2021-03-02 NOTE — Therapy (Signed)
[place] MAIN [facility] SERVICES [address] [place], [place], [address] Phone: [phone]   Fax:  [phone]  Physical Therapy Evaluation  Patient Details  Name: Brenda Savage MRN: [id number] Date of Birth: 1975-11-05 Referring Provider (PT): Theophilus Bones MD   Encounter Date: 03/01/2021   PT End of Session - 03/01/21 1625     Visit Number 1    Number of Visits 20    Date for PT Re-Evaluation 05/10/21    Progress Note Due on Visit 10    PT Start Time 1608    PT Stop Time 1700    PT Time Calculation (min) 52 min    Equipment Utilized During Treatment Gait belt    Activity Tolerance Patient tolerated treatment well    Behavior During Therapy WFL for tasks assessed/performed             Past Medical History:  Diagnosis Date   Anemia    Anxiety    Chest pain     Past Surgical History:  Procedure Laterality Date   CHOLECYSTECTOMY     DILATION AND CURETTAGE OF UTERUS      There were no vitals filed for this visit.    Subjective Assessment - 03/01/21 1609     Subjective Pt reports she was in car during work and her face went numb and felt weird on the left side. That went away and came back later that day. Later that evening pt felt numbness and had visual ipmairments and she decided to go to hospital. No results were found and followed up with PCP. Pt is following up with PCP on 03/01/21. Pt has been using walker tin house and community and has not been driving or walking long distances. Pt reports intermittent balance deficits ove rthe past year but they have not lingered like this has. Pt reports altered sensation in the L LE from slightly above ankle throughout her toe. Pt reports not able to complete laundry activities or taking her dogs out and has to rely on family suppirt for these tasks.    Patient is accompained by: Family member    Limitations Standing;Walking;House hold activities    How long can you stand  comfortably? 30 min or so    How long can you walk comfortably? around the house, approximately 75 feet    Patient Stated Goals improve balance, endurance, leg strength, an dwalking without any devices    Currently in Pain? Yes    Pain Score 4     Pain Location Back    Pain Orientation Mid    Pain Descriptors / Indicators Sore    Pain Type Chronic pain    Pain Onset More than a month ago    Pain Frequency Constant    Aggravating Factors  staying still for too long    Pain Relieving Factors stretching                 5XSTS 17 sec   10MWT: .42 m/s          Objective measurements completed on examination: See above findings.       Exercise/Activity Sets/Reps/Time/ Resistance Assistance Charge type Comments  STS 1 x 5  No UE, SBA for balance  Therex  HEP intro  Standing marching  X 10 ea LE  UE assist  Theres HEP intro  Standing toe/ heel raises  X 10 ea  UE for balance  Therex Hep intro  Treatment Provided this session   Pt educated throughout session about proper posture and technique with exercises. Improved exercise technique, movement at target joints, use of target muscles after min to mod verbal, visual, tactile cues. Note: Portions of this document were prepared using Dragon voice recognition software and although reviewed may contain unintentional dictation errors in syntax, grammar, or spelling.           PT Education - 03/01/21 1719     Education Details POC, HEP    Person(s) Educated Patient    Methods Explanation    Comprehension Verbalized understanding;Returned demonstration              PT Short Term Goals - 03/02/21 0747       PT SHORT TERM GOAL #1   Title Patient will be independent in home exercise program to improve strength/mobility for better functional independence with ADLs.    Baseline No formal HEP at this time    Time 4    Period Weeks    Status New     Target Date 03/30/21      PT SHORT TERM GOAL #2   Title Patient will increase FOTO score to equal to or greater than  40   to demonstrate statistically significant improvement in mobility and quality of life.    Baseline FOTO 34    Time 6    Period Weeks    Status New    Target Date 04/13/21               PT Long Term Goals - 03/02/21 0750       PT LONG TERM GOAL #2   Title Patient (< 30 years old) will complete five times sit to stand test in < 10 seconds indicating an increased LE strength and improved balance.    Baseline 17 s (12/14)    Time 8    Period Weeks    Status New    Target Date 04/27/21      PT LONG TERM GOAL #3   Title Patient will increase Berg Balance score by > 6 points to demonstrate decreased fall risk during functional activities.    Baseline BERG 47 12/14    Time 8    Period Weeks    Status New    Target Date 04/27/21      PT LONG TERM GOAL #4   Title Patient will increase six minute walk test distance to >1000 with LRAD for progression to community ambulator and improve gait ability    Baseline able to walk approximately 75 feet in clinic at eval    Time 12    Period Weeks    Target Date 05/25/21      PT LONG TERM GOAL #5   Title Patient will increase 10 meter walk test to >1.58m/s as to improve gait speed for better community ambulation and to reduce fall risk.    Baseline .41m/s on 12/14    Time 10    Period Weeks    Status New    Target Date 05/11/21                    Plan - 03/01/21 1626     Clinical Impression Statement Pt is 45 y.o. female who presents to therapy following hospital visit where she had left sided weakness. Pt reports the weakness and numbness was on her Left arm and face but is now only in her L LE. Pt presents with weakness on  her L LE globally, balance deficits as evidenced by BERG balance test, ambuilatory capacity deficits as evidenced by 10MWT, and is at risk for falls as evidenced by 0MWT, BERG balance  test and 5XSTS. Pt was provided with initial HEP of sit to stands, marching and toe/heel raises. 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    Personal Factors and Comorbidities Other   No known diagnosis   Examination-Activity Limitations Carry;Locomotion Level;Squat;Stairs    Examination-Participation Restrictions Cleaning;Community Activity;Driving;Laundry;Occupation;Shop    Stability/Clinical Decision Making Evolving/Moderate complexity    Clinical Decision Making Moderate    Rehab Potential Good    PT Frequency 2x / week    PT Duration --   10 weeks   PT Treatment/Interventions ADLs/Self Care Home Management;Biofeedback;Moist Heat;Gait training;Stair training;Functional mobility training;Therapeutic activities;Therapeutic exercise;Balance training;Neuromuscular re-education;Patient/family education;Manual techniques;Passive range of motion;Dry needling;Energy conservation    PT Next Visit Plan HEP    PT Home Exercise Plan To establish visit 2    Consulted and Agree with Plan of Care Patient             Patient will benefit from skilled therapeutic intervention in order to improve the following deficits and impairments:  Abnormal gait, Decreased activity tolerance, Decreased endurance, Decreased strength, Decreased balance, Difficulty walking, Impaired flexibility  Visit Diagnosis: Difficulty in walking, not elsewhere classified  Muscle weakness (generalized)  Other abnormalities of gait and mobility  Unsteadiness on feet     Problem List Patient Active Problem List   Diagnosis Date Noted   Paresthesia 02/13/2021   Left-sided weakness 02/13/2021   Shortness of breath 02/13/2021   Anemia 02/13/2021   Goiter, 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 [place], [place], [address] Phone: [phone]   Fax:  [phone]  Name: Drina Jobst MRN: [id number] Date of Birth: 12-14-1975

## 2021-03-03 ENCOUNTER — Ambulatory Visit: Payer: 59

## 2021-03-08 ENCOUNTER — Ambulatory Visit: Payer: 59

## 2021-03-08 ENCOUNTER — Other Ambulatory Visit: Payer: Self-pay

## 2021-03-08 DIAGNOSIS — R269 Unspecified abnormalities of gait and mobility: Secondary | ICD-10-CM

## 2021-03-08 DIAGNOSIS — M6281 Muscle weakness (generalized): Secondary | ICD-10-CM

## 2021-03-08 DIAGNOSIS — R278 Other lack of coordination: Secondary | ICD-10-CM

## 2021-03-08 DIAGNOSIS — R262 Difficulty in walking, not elsewhere classified: Secondary | ICD-10-CM

## 2021-03-08 DIAGNOSIS — R2681 Unsteadiness on feet: Secondary | ICD-10-CM

## 2021-03-08 NOTE — Therapy (Signed)
[place] MAIN [facility] SERVICES [address] [place], [place], [address] Phone: [phone]   Fax:  [phone]  Physical Therapy Treatment  Patient Details  Name: Brenda Savage MRN: [id number] Date of Birth: 1976/03/08 Referring Provider (PT): Theophilus Bones MD   Encounter Date: 03/08/2021   PT End of Session - 03/08/21 1603     Visit Number 2    Number of Visits 20    Date for PT Re-Evaluation 05/10/21    Progress Note Due on Visit 10    PT Start Time 0938    PT Stop Time 1520    PT Time Calculation (min) 42 min    Equipment Utilized During Treatment Gait belt    Activity Tolerance Patient tolerated treatment well    Behavior During Therapy WFL for tasks assessed/performed             Past Medical History:  Diagnosis Date   Anemia    Anxiety    Chest pain     Past Surgical History:  Procedure Laterality Date   CHOLECYSTECTOMY     DILATION AND CURETTAGE OF UTERUS      There were no vitals filed for this visit.   Subjective Assessment - 03/08/21 1556     Subjective Patient reports frustrated about her leg but states doing okay and that she did complete some of the HEP. States the left ankle just feels weird like "icy hot." She also reports walking some at home without any device and denies any further falls.    Patient is accompained by: Family member    Limitations Standing;Walking;House hold activities    How long can you stand comfortably? 30 min or so    How long can you walk comfortably? around the house, approximately 75 feet    Patient Stated Goals improve balance, endurance, leg strength, an dwalking without any devices    Currently in Pain? Yes   did not rate- Complaining of "weird sensation in left ankle- like a icy hot feeling."   Pain Onset More than a month ago                   Exercise/Activity Sets/Reps/Time/ Resistance Assistance Charge type Comments  STS 1 x 10 No UE, SBA for balance   Therex  HEP review- VC for technique including scooting forward and leaning trunk forward. Patient able to complete well without UE support  Standing marching  X 10 ea LE  UE assist  Theres HEP review- VC for height at // bars today. No LOB  Standing toe/ heel raises  X 10 ea  UE for balance  Therex Hep Review - VC to slow down for best technique   Side stepping  x 5  UE support on // bars  Therex  Added to HEP- VC for correct technique and to keep feet straight and take a wider step   Standing Hip ext  x 10 alt LE  UE Support on // bars  Therex  Added to HEP- VC for correct technique and to maintain erect posture. Cues to tighten glutes for best contraction/activation of musculature.    Ambulation forward in // bars  x 8 trials  Hands hovering over // bars  Therex  Added to HEP- VC to increase step length as able to try not to look down.    Step up onto 6" step  x 10 Left LE  UE support on // bars  Therex  Added to HEP-  VC for hand placement and to take her time for optimal safety.                                                    Access Code: [email or web address] URL: [email or web address] Date: 03/08/2021 Prepared by: Sande Brothers  Exercises Side Stepping with Counter Support - 1 x daily - 7 x weekly - 3 sets - 10 reps Forward and Backward Walking with Eyes Closed and Counter Support - 1 x daily - 7 x weekly - 3 sets - 10 reps Standing Hip Extension with Counter Support - 1 x daily - 7 x weekly - 3 sets - 10 reps - 2 hold Forward Step Up - 1 x daily - 7 x weekly - 3 sets - 10 reps  Education provided throughout session via VC/TC and demonstration to facilitate movement at target joints and correct muscle activation for all testing and exercises performed.                  PT Education - 03/08/21 1602     Education Details Exercise technique and HEP review/progression.    Person(s) Educated Patient    Methods Explanation;Demonstration;Tactile cues;Verbal cues     Comprehension Verbalized understanding;Returned demonstration;Verbal cues required;Tactile cues required;Need further instruction              PT Short Term Goals - 03/02/21 0747       PT SHORT TERM GOAL #1   Title Patient will be independent in home exercise program to improve strength/mobility for better functional independence with ADLs.    Baseline No formal HEP at this time    Time 4    Period Weeks    Status New    Target Date 03/30/21      PT SHORT TERM GOAL #2   Title Patient will increase FOTO score to equal to or greater than  40   to demonstrate statistically significant improvement in mobility and quality of life.    Baseline FOTO 34    Time 6    Period Weeks    Status New    Target Date 04/13/21               PT Long Term Goals - 03/02/21 0750       PT LONG TERM GOAL #2   Title Patient (< 43 years old) will complete five times sit to stand test in < 10 seconds indicating an increased LE strength and improved balance.    Baseline 17 s (12/14)    Time 8    Period Weeks    Status New    Target Date 04/27/21      PT LONG TERM GOAL #3   Title Patient will increase Berg Balance score by > 6 points to demonstrate decreased fall risk during functional activities.    Baseline BERG 47 12/14    Time 8    Period Weeks    Status New    Target Date 04/27/21      PT LONG TERM GOAL #4   Title Patient will increase six minute walk test distance to >1000 with LRAD for progression to community ambulator and improve gait ability    Baseline able to walk approximately 75 feet in clinic at eval    Time 12    Period Weeks  Target Date 05/25/21      PT LONG TERM GOAL #5   Title Patient will increase 10 meter walk test to >1.49m/s as to improve gait speed for better community ambulation and to reduce fall risk.    Baseline .57m/s on 12/14    Time 10    Period Weeks    Status New    Target Date 05/11/21                   Plan - 03/08/21 1603      Clinical Impression Statement ?Patient responded well to therapeutic exercises today- including reivew of previously prescribed strengthening exercises as well as addition to therex today. PT provided updated handout with new exercises. Patient was tentative initially with using left LE but did improve overall with increased practice. She continues to describe "weird sensation"- some numbness, burning, tingling from mid lower leg down on left side but did not worsen with PT today. Patient will benefit from continued PT interventions designed to promote improved overall LE Strength, dynamic balance, functional mobility to reduce risk of falling and assist in safe return to previous level of function and work.    Personal Factors and Comorbidities Other   No known diagnosis   Examination-Activity Limitations Carry;Locomotion Level;Squat;Stairs    Examination-Participation Restrictions Cleaning;Community Activity;Driving;Laundry;Occupation;Shop    Stability/Clinical Decision Making Evolving/Moderate complexity    Rehab Potential Good    PT Frequency 2x / week    PT Duration --   10 weeks   PT Treatment/Interventions ADLs/Self Care Home Management;Biofeedback;Moist Heat;Gait training;Stair training;Functional mobility training;Therapeutic activities;Therapeutic exercise;Balance training;Neuromuscular re-education;Patient/family education;Manual techniques;Passive range of motion;Dry needling;Energy conservation    PT Next Visit Plan Progress HEP, further assess balance, inquire regarding how MD appointment went    PT Home Exercise Plan Began visit 1, (marching, STS, HR); 03/08/2021=Access Code: [email or web address]  URL: [email or web address]    Consulted and Agree with Plan of Care Patient             Patient will benefit from skilled therapeutic intervention in order to improve the following deficits and impairments:  Abnormal gait, Decreased activity tolerance, Decreased endurance, Decreased  strength, Decreased balance, Difficulty walking, Impaired flexibility  Visit Diagnosis: Abnormality of gait and mobility  Difficulty in walking, not elsewhere classified  Muscle weakness (generalized)  Other lack of coordination  Unsteadiness on feet     Problem List Patient Active Problem List   Diagnosis Date Noted   Paresthesia 02/13/2021   Left-sided weakness 02/13/2021   Shortness of breath 02/13/2021   Anemia 02/13/2021   Goiter, 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

## 2021-03-15 ENCOUNTER — Ambulatory Visit: Payer: 59

## 2021-03-15 ENCOUNTER — Other Ambulatory Visit: Payer: Self-pay

## 2021-03-15 DIAGNOSIS — R262 Difficulty in walking, not elsewhere classified: Secondary | ICD-10-CM | POA: Diagnosis not present

## 2021-03-15 DIAGNOSIS — M6281 Muscle weakness (generalized): Secondary | ICD-10-CM

## 2021-03-15 DIAGNOSIS — R269 Unspecified abnormalities of gait and mobility: Secondary | ICD-10-CM

## 2021-03-15 DIAGNOSIS — R278 Other lack of coordination: Secondary | ICD-10-CM

## 2021-03-15 DIAGNOSIS — R2681 Unsteadiness on feet: Secondary | ICD-10-CM

## 2021-03-15 NOTE — Therapy (Signed)
[place] MAIN [facility] SERVICES [address] [place], [place], [address] Phone: [phone]   Fax:  [phone]  Physical Therapy Treatment  Patient Details  Name: Brenda Savage MRN: [id number] Date of Birth: 06-Oct-1975 Referring Provider (PT): Theophilus Bones MD   Encounter Date: 03/15/2021   PT End of Session - 03/15/21 1610     Visit Number 3    Number of Visits 20    Date for PT Re-Evaluation 05/10/21    Progress Note Due on Visit 10    PT Start Time 1603    PT Stop Time 1646    PT Time Calculation (min) 43 min    Equipment Utilized During Treatment Gait belt    Activity Tolerance Patient tolerated treatment well    Behavior During Therapy WFL for tasks assessed/performed             Past Medical History:  Diagnosis Date   Anemia    Anxiety    Chest pain     Past Surgical History:  Procedure Laterality Date   CHOLECYSTECTOMY     DILATION AND CURETTAGE OF UTERUS      There were no vitals filed for this visit.   Subjective Assessment - 03/15/21 1607     Subjective Patient reports she is continuing to be bothered by her left lower leg- Prickly sensation and restless leg per her report.    Patient is accompained by: Family member    Limitations Standing;Walking;House hold activities    How long can you stand comfortably? 30 min or so    How long can you walk comfortably? around the house, approximately 75 feet    Patient Stated Goals improve balance, endurance, leg strength, an dwalking without any devices    Currently in Pain? Yes    Pain Score 7     Pain Location Leg   Left lateral lower leg   Pain Orientation Left;Lateral    Pain Descriptors / Indicators Sore;Numbness    Pain Type Chronic pain    Pain Onset More than a month ago    Pain Frequency Constant                Exercise/Activity Sets/Reps/Time/ Resistance Assistance Charge type Comments  STS 1 x 10 No UE, SBA for balance  Therex  HEP review-  VC for technique including scooting forward and leaning trunk forward. Patient able to complete well without UE support  Standing marching  X 10 ea LE with 3lb AW UE assist  Therex  VC for height at // bars today. No LOB yet decreased height and speed of action with left LE  Standing toe/ heel raises  X 10 ea  UE for balance  Therex  - VC to slow down for best technique   Heel to toe activitiy- swing leg from toe off to heel strike  2 sets of 15 reps each LE  UE for support in // bars  Therex  VC and placed 1/2 foam down on floor at heelstrike position and a hedgehog 1/2 sphere at toe off- Decreased swing and heel strike with left but did improve with practice.   Standing Hip ext  x 12 alt LE 3lb. AW  UE Support on // bars  Therex  Added to HEP- VC for correct technique and to maintain erect posture. Cues to tighten glutes for best contraction/activation of musculature.    Ambulation forward in // bars  x 8 trials  Hands hovering over //  bars  Therex  Added to HEP- VC to increase step length as able to try not to look down.    Step up onto 6" step  x 10 Left LE  UE support on // bars  Therex  Added to HEP- VC for hand placement and to take her time for optimal safety.    Stand ham curl Stand hip abd  3lb. AW  UE support on // bars  Therex  VC for correct technique.    Leg press  25 lb on left  40 lb on right 12 reps each  Close SBA  Therex  Verbal and visual cues for best technique.    Calf press  25lb Left LE  X 12 reps  Close SBA  Therex  VC for foot position and reminders to keep knee straight.                        Education provided throughout session via VC/TC and demonstration to facilitate movement at target joints and correct muscle activation for all testing and exercises performed.       .                    PT Education - 03/15/21 1610     Education Details Exercise technique    Person(s) Educated Patient    Methods Explanation;Demonstration;Tactile  cues;Verbal cues    Comprehension Verbalized understanding;Returned demonstration;Verbal cues required;Tactile cues required;Need further instruction              PT Short Term Goals - 03/02/21 0747       PT SHORT TERM GOAL #1   Title Patient will be independent in home exercise program to improve strength/mobility for better functional independence with ADLs.    Baseline No formal HEP at this time    Time 4    Period Weeks    Status New    Target Date 03/30/21      PT SHORT TERM GOAL #2   Title Patient will increase FOTO score to equal to or greater than  40   to demonstrate statistically significant improvement in mobility and quality of life.    Baseline FOTO 34    Time 6    Period Weeks    Status New    Target Date 04/13/21               PT Long Term Goals - 03/02/21 0750       PT LONG TERM GOAL #2   Title Patient (< 30 years old) will complete five times sit to stand test in < 10 seconds indicating an increased LE strength and improved balance.    Baseline 17 s (12/14)    Time 8    Period Weeks    Status New    Target Date 04/27/21      PT LONG TERM GOAL #3   Title Patient will increase Berg Balance score by > 6 points to demonstrate decreased fall risk during functional activities.    Baseline BERG 47 12/14    Time 8    Period Weeks    Status New    Target Date 04/27/21      PT LONG TERM GOAL #4   Title Patient will increase six minute walk test distance to >1000 with LRAD for progression to community ambulator and improve gait ability    Baseline able to walk approximately 75 feet in clinic at eval  Time 12    Period Weeks    Target Date 05/25/21      PT LONG TERM GOAL #5   Title Patient will increase 10 meter walk test to >1.41m/s as to improve gait speed for better community ambulation and to reduce fall risk.    Baseline .2m/s on 12/14    Time 10    Period Weeks    Status New    Target Date 05/11/21                   Plan -  03/15/21 1611     Clinical Impression Statement Patient well motivated for today's session and responded well with all strengthening exercises today. She exhibited more fatigue and weakness with left LE yet able to modify position or weight to complete task. She also demonstrated decreased step length and heel to gait sequencing so added an exercise for home to assist in improving her gait quality. Patient will benefit from continued PT interventions designed to promote improved overall LE Strength, dynamic balance, functional mobility to reduce risk of falling and assist in safe return to previous level of function and work    Personal Factors and Comorbidities Other   No known diagnosis   Examination-Activity Limitations Carry;Locomotion Level;Squat;Stairs    Examination-Participation Restrictions Cleaning;Community Activity;Driving;Laundry;Occupation;Shop    Stability/Clinical Decision Making Evolving/Moderate complexity    Rehab Potential Good    PT Frequency 2x / week    PT Duration --   10 weeks   PT Treatment/Interventions ADLs/Self Care Home Management;Biofeedback;Moist Heat;Gait training;Stair training;Functional mobility training;Therapeutic activities;Therapeutic exercise;Balance training;Neuromuscular re-education;Patient/family education;Manual techniques;Passive range of motion;Dry needling;Energy conservation    PT Next Visit Plan Progress HEP, further assess balance, inquire regarding how MD appointment went    PT Home Exercise Plan Began visit 1, (marching, STS, HR); 03/08/2021=Access Code: [email or web address]  URL: [email or web address]    Consulted and Agree with Plan of Care Patient             Patient will benefit from skilled therapeutic intervention in order to improve the following deficits and impairments:  Abnormal gait, Decreased activity tolerance, Decreased endurance, Decreased strength, Decreased balance, Difficulty walking, Impaired flexibility  Visit  Diagnosis: Abnormality of gait and mobility  Difficulty in walking, not elsewhere classified  Muscle weakness (generalized)  Other lack of coordination  Unsteadiness on feet     Problem List Patient Active Problem List   Diagnosis Date Noted   Paresthesia 02/13/2021   Left-sided weakness 02/13/2021   Shortness of breath 02/13/2021   Anemia 02/13/2021   Goiter, 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 [place], [place], [address] Phone: [phone]   Fax:  [phone]  Name: Jhanvi Drakeford MRN: [id number] Date of Birth: 09-24-1975

## 2021-03-22 ENCOUNTER — Other Ambulatory Visit: Payer: Self-pay

## 2021-03-22 ENCOUNTER — Ambulatory Visit: Payer: 59 | Attending: Family Medicine

## 2021-03-22 DIAGNOSIS — M6281 Muscle weakness (generalized): Secondary | ICD-10-CM | POA: Diagnosis present

## 2021-03-22 DIAGNOSIS — R269 Unspecified abnormalities of gait and mobility: Secondary | ICD-10-CM | POA: Diagnosis not present

## 2021-03-22 DIAGNOSIS — R262 Difficulty in walking, not elsewhere classified: Secondary | ICD-10-CM

## 2021-03-22 DIAGNOSIS — R2681 Unsteadiness on feet: Secondary | ICD-10-CM

## 2021-03-22 NOTE — Therapy (Signed)
[place] MAIN [facility] SERVICES [address] [place], [place], [address] Phone: [phone]   Fax:  [phone]  Physical Therapy Treatment  Patient Details  Name: Brenda Savage MRN: [id number] Date of Birth: 11/13/1975 Referring Provider (PT): Theophilus Bones MD   Encounter Date: 03/22/2021   PT End of Session - 03/22/21 2222     Visit Number 4    Number of Visits 20    Date for PT Re-Evaluation 05/10/21    Progress Note Due on Visit 10    PT Start Time 1600    PT Stop Time 1645    PT Time Calculation (min) 45 min    Equipment Utilized During Treatment Gait belt    Activity Tolerance Patient tolerated treatment well    Behavior During Therapy WFL for tasks assessed/performed             Past Medical History:  Diagnosis Date   Anemia    Anxiety    Chest pain     Past Surgical History:  Procedure Laterality Date   CHOLECYSTECTOMY     DILATION AND CURETTAGE OF UTERUS      There were no vitals filed for this visit.   Subjective Assessment - 03/22/21 1605     Subjective Patient reports feeling better overall and has been walking some without her walker    Patient is accompained by: Family member    Limitations Standing;Walking;House hold activities    How long can you stand comfortably? 30 min or so    How long can you walk comfortably? around the house, approximately 75 feet    Patient Stated Goals improve balance, endurance, leg strength, an dwalking without any devices    Currently in Pain? Yes    Pain Score 5     Pain Location Leg    Pain Orientation Lower;Lateral    Pain Descriptors / Indicators Sore    Pain Type Chronic pain    Pain Onset More than a month ago                      Exercise/Activity Sets/Reps/Time/ Resistance Assistance Charge type Comments  Calf stretching using prostretch Hold 20 sec x 4 sets Set up- VC and visual demonstration Therex  Patient reports feeling much better and  "looser" after stretching.   Standing calf stretch Hold 20 sec x 3 sets each SBA Therex  VC for correct technique  Ankle Strengthening- Eversion 2 sets/10 reps/red Theraband Physical Assist to set up and perform Therex  -VC for correct technique - patient fatigues quickly                         Ambulation in gym without an AD  approx 150 feet  Supervision  GAIT  VC for heel strike- No LOB   Step up onto 6" step  x 10 Left LE  UE support on handrail  Therex  VC to decrease UE Support.               Leg press  40lb BLE x12 simultaneous25 lb on left 12 reps 40 lb on left x 5 reps  Close SBA  Therex  Verbal and visual cues to perform slow and with control.     Calf press  25lb Left LE  X 12 reps  Close SBA  Therex  VC for foot position and reminders to keep knee straight.    BIODEX GAIT TRAINER  6 min  SBA  GAIT  50/50 time on each foot, Decreased step length and speed at 0.67m/s with BUE Support.            Education provided throughout session via VC/TC and demonstration to facilitate movement at target joints and correct muscle activation for all testing and exercises performed.        .                       PT Education - 03/22/21 1609     Education Details Exercise technique    Person(s) Educated Patient    Methods Explanation;Demonstration;Tactile cues;Verbal cues    Comprehension Verbalized understanding;Returned demonstration;Verbal cues required;Tactile cues required;Need further instruction              PT Short Term Goals - 03/02/21 0747       PT SHORT TERM GOAL #1   Title Patient will be independent in home exercise program to improve strength/mobility for better functional independence with ADLs.    Baseline No formal HEP at this time    Time 4    Period Weeks    Status New    Target Date 03/30/21      PT SHORT TERM GOAL #2   Title Patient will increase FOTO score to equal to or greater than  40   to demonstrate statistically significant  improvement in mobility and quality of life.    Baseline FOTO 34    Time 6    Period Weeks    Status New    Target Date 04/13/21               PT Long Term Goals - 03/02/21 0750       PT LONG TERM GOAL #2   Title Patient (< 36 years old) will complete five times sit to stand test in < 10 seconds indicating an increased LE strength and improved balance.    Baseline 17 s (12/14)    Time 8    Period Weeks    Status New    Target Date 04/27/21      PT LONG TERM GOAL #3   Title Patient will increase Berg Balance score by > 6 points to demonstrate decreased fall risk during functional activities.    Baseline BERG 47 12/14    Time 8    Period Weeks    Status New    Target Date 04/27/21      PT LONG TERM GOAL #4   Title Patient will increase six minute walk test distance to >1000 with LRAD for progression to community ambulator and improve gait ability    Baseline able to walk approximately 75 feet in clinic at eval    Time 12    Period Weeks    Target Date 05/25/21      PT LONG TERM GOAL #5   Title Patient will increase 10 meter walk test to >1.64m/s as to improve gait speed for better community ambulation and to reduce fall risk.    Baseline .9m/s on 12/14    Time 10    Period Weeks    Status New    Target Date 05/11/21                   Plan - 03/22/21 2222     Clinical Impression Statement Patient presents with good motivation and pain well controlled today. She responded favorably today with VC for all LE strengthening witout  report of any pain. She was able to demo improved functional mobility and no significant difficulty with balance today. She exhibited improved overall gait quality including heel strike and improved step length overall. She was able to walk in clinic without AD without LOB today. She did present with quick fatigue with LE Strengthening including quads, calves and ankle EV today.  Patient will benefit from continued PT services designed  to promote improved overall LE strength, dynamic balance, functional mobility to reduce risk of falling and assist in return to previous level of function and work.    Personal Factors and Comorbidities Other   No known diagnosis   Examination-Activity Limitations Carry;Locomotion Level;Squat;Stairs    Examination-Participation Restrictions Cleaning;Community Activity;Driving;Laundry;Occupation;Shop    Stability/Clinical Decision Making Evolving/Moderate complexity    Rehab Potential Good    PT Frequency 2x / week    PT Duration --   10 weeks   PT Treatment/Interventions ADLs/Self Care Home Management;Biofeedback;Moist Heat;Gait training;Stair training;Functional mobility training;Therapeutic activities;Therapeutic exercise;Balance training;Neuromuscular re-education;Patient/family education;Manual techniques;Passive range of motion;Dry needling;Energy conservation    PT Next Visit Plan Progress LE Strengthening; dynamic balance and gait training as appropriate.    PT Home Exercise Plan Began visit 1, (marching, STS, HR); 03/08/2021=Access Code: [email or web address]  URL: [email or web address]    Consulted and Agree with Plan of Care Patient             Patient will benefit from skilled therapeutic intervention in order to improve the following deficits and impairments:  Abnormal gait, Decreased activity tolerance, Decreased endurance, Decreased strength, Decreased balance, Difficulty walking, Impaired flexibility  Visit Diagnosis: Abnormality of gait and mobility  Difficulty in walking, not elsewhere classified  Muscle weakness (generalized)  Unsteadiness on feet     Problem List Patient Active Problem List   Diagnosis Date Noted   Paresthesia 02/13/2021   Left-sided weakness 02/13/2021   Shortness of breath 02/13/2021   Anemia 02/13/2021   Goiter, 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 [place], [place], [address] Phone: [phone]   Fax:  [phone]  Name: Brenda Savage MRN: [id number] Date of Birth: [birth year]

## 2021-03-24 ENCOUNTER — Other Ambulatory Visit: Payer: Self-pay

## 2021-03-24 ENCOUNTER — Ambulatory Visit: Payer: 59

## 2021-03-24 DIAGNOSIS — R262 Difficulty in walking, not elsewhere classified: Secondary | ICD-10-CM

## 2021-03-24 DIAGNOSIS — M6281 Muscle weakness (generalized): Secondary | ICD-10-CM

## 2021-03-24 DIAGNOSIS — R269 Unspecified abnormalities of gait and mobility: Secondary | ICD-10-CM

## 2021-03-24 DIAGNOSIS — R2681 Unsteadiness on feet: Secondary | ICD-10-CM

## 2021-03-24 NOTE — Therapy (Signed)
[place] MAIN [facility] SERVICES [address] [place], [place], [address] Phone: [phone]   Fax:  [phone]  Physical Therapy Treatment  Patient Details  Name: Brenda Savage MRN: [id number] Date of Birth: [birth year] Referring Provider (PT): Theophilus Bones MD   Encounter Date: 03/24/2021   PT End of Session - 03/24/21 1602     Visit Number 5    Number of Visits 20    Date for PT Re-Evaluation 05/10/21    Progress Note Due on Visit 10    PT Start Time 1555    PT Stop Time 1635    PT Time Calculation (min) 40 min    Equipment Utilized During Treatment Gait belt    Activity Tolerance Patient tolerated treatment well    Behavior During Therapy WFL for tasks assessed/performed             Past Medical History:  Diagnosis Date   Anemia    Anxiety    Chest pain     Past Surgical History:  Procedure Laterality Date   CHOLECYSTECTOMY     DILATION AND CURETTAGE OF UTERUS      There were no vitals filed for this visit.   Subjective Assessment - 03/24/21 1558     Subjective Patient reports being fatigued and less pain overall    Patient is accompained by: Family member    Limitations Standing;Walking;House hold activities    How long can you stand comfortably? 30 min or so    How long can you walk comfortably? around the house, approximately 75 feet    Patient Stated Goals improve balance, endurance, leg strength, an dwalking without any devices    Pain Score 4     Pain Location Leg    Pain Orientation Lower;Lateral    Pain Descriptors / Indicators Discomfort    Pain Type Chronic pain    Pain Onset More than a month ago    Pain Frequency Constant    Aggravating Factors  if I am up too long or over do    Pain Relieving Factors Stretching    Multiple Pain Sites No           INTERVENTIONS:   Prostretch calf stretch Hold 30 sec x 4 sets BLE (simultaneously)  Seated ankle EV stretch with towel ( VC and visual demo  for correct technique) - Hold 30 sec x 4 sets   Long sit - Calf stretch- Hold 30 sec x 3 sets (patient reported a good stretch)  BAPS board- level 4  -Ankle EV/IV x 20 reps  -Ankle DF/PF x 20 reps -Ankle Circles (CW) x 20 reps *patient reported fatigue yet able to follow cues and perform correctly.   Yellow Dynadisc- Seated ankle EV/IV- with no significant difficulty.   Strengthening with Marga Hoots: Ankle DF Ankle PF Ankle EV Ankle IV 2 sets of 12 reps each with Education provided throughout session via VC/TC and demonstration to facilitate movement at target joints and correct muscle activation for all testing and exercises performed. Patient demo fatigue overall with all ankle strengthening    Access Code: [email or web address] URL: [email or web address] Date: 03/24/2021 Prepared by: Sande Brothers  Exercises Ankle Inversion Eversion PROM in Dorsiflexion - 1 x daily - 7 x weekly - 3 sets - 2-30 sec hold Standing Bilateral Gastroc Stretch with Step - 1 x daily - 7 x weekly - 3 sets - 20-30 sec hold Long Sitting Calf Stretch with Strap - 1 x daily - 7  x weekly - 3 sets - 20-30 sec hold Ankle Inversion with Resistance - 1 x daily - 7 x weekly - 3 sets - 10 reps - 2 hold Ankle and Toe Plantarflexion with Resistance - 1 x daily - 7 x weekly - 3 sets - 10 reps - 2 hold Ankle Eversion with Resistance - 1 x daily - 7 x weekly - 3 sets - 10 reps - 2 hold    Patient presents with good motivation for today's session and improving report of pain overall- Patient reports feeling a good stretch with prostretch and towel and able to progress ankle Strengthening without report of increased pain. She did exhibit increased left ankle fatigue yet able to complete all requested therex today. Patient will benefit from continued PT services designed to promote improved overall LE strength, dynamic balance, functional mobility to reduce risk of falling and assist in return to previous  level of function and work.                     PT Education - 03/24/21 1601     Education Details Exercise technique    Person(s) Educated Patient    Methods Explanation    Comprehension Verbalized understanding;Returned demonstration;Verbal cues required;Tactile cues required;Need further instruction              PT Short Term Goals - 03/02/21 0747       PT SHORT TERM GOAL #1   Title Patient will be independent in home exercise program to improve strength/mobility for better functional independence with ADLs.    Baseline No formal HEP at this time    Time 4    Period Weeks    Status New    Target Date 03/30/21      PT SHORT TERM GOAL #2   Title Patient will increase FOTO score to equal to or greater than  40   to demonstrate statistically significant improvement in mobility and quality of life.    Baseline FOTO 34    Time 6    Period Weeks    Status New    Target Date 04/13/21               PT Long Term Goals - 03/02/21 0750       PT LONG TERM GOAL #2   Title Patient (< 69 years old) will complete five times sit to stand test in < 10 seconds indicating an increased LE strength and improved balance.    Baseline 17 s (12/14)    Time 8    Period Weeks    Status New    Target Date 04/27/21      PT LONG TERM GOAL #3   Title Patient will increase Berg Balance score by > 6 points to demonstrate decreased fall risk during functional activities.    Baseline BERG 47 12/14    Time 8    Period Weeks    Status New    Target Date 04/27/21      PT LONG TERM GOAL #4   Title Patient will increase six minute walk test distance to >1000 with LRAD for progression to community ambulator and improve gait ability    Baseline able to walk approximately 75 feet in clinic at eval    Time 12    Period Weeks    Target Date 05/25/21      PT LONG TERM GOAL #5   Title Patient will increase 10 meter walk test to >1.12m/s  as to improve gait speed for better  community ambulation and to reduce fall risk.    Baseline .16m/s on 12/14    Time 10    Period Weeks    Status New    Target Date 05/11/21                   Plan - 03/24/21 1602     Clinical Impression Statement Patient presents with good motivation for today's session and improving report of pain overall- Patient reports feeling a good stretch with prostretch and towel and able to progress ankle Strengthening without report of increased pain. She did exhibit increased left ankle fatigue yet able to complete all requested therex today. Patient will benefit from continued PT services designed to promote improved overall LE strength, dynamic balance, functional mobility to reduce risk of falling and assist in return to previous level of function and work.    Personal Factors and Comorbidities Other   No known diagnosis   Examination-Activity Limitations Carry;Locomotion Level;Squat;Stairs    Examination-Participation Restrictions Cleaning;Community Activity;Driving;Laundry;Occupation;Shop    Stability/Clinical Decision Making Evolving/Moderate complexity    Rehab Potential Good    PT Frequency 2x / week    PT Duration --   10 weeks   PT Treatment/Interventions ADLs/Self Care Home Management;Biofeedback;Moist Heat;Gait training;Stair training;Functional mobility training;Therapeutic activities;Therapeutic exercise;Balance training;Neuromuscular re-education;Patient/family education;Manual techniques;Passive range of motion;Dry needling;Energy conservation    PT Next Visit Plan Progress LE Strengthening; dynamic balance and gait training as appropriate.    PT Home Exercise Plan Began visit 1, (marching, STS, HR); 03/08/2021=Access Code: [email or web address]  URL: [email or web address]    Consulted and Agree with Plan of Care Patient             Patient will benefit from skilled therapeutic intervention in order to improve the following deficits and impairments:  Abnormal gait,  Decreased activity tolerance, Decreased endurance, Decreased strength, Decreased balance, Difficulty walking, Impaired flexibility  Visit Diagnosis: Abnormality of gait and mobility  Difficulty in walking, not elsewhere classified  Muscle weakness (generalized)  Unsteadiness on feet     Problem List Patient Active Problem List   Diagnosis Date Noted   Paresthesia 02/13/2021   Left-sided weakness 02/13/2021   Shortness of breath 02/13/2021   Anemia 02/13/2021   Goiter, 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 [place], [place], [address] Phone: [phone]   Fax:  [phone]  Name: Brenda Savage MRN: [id number] Date of Birth: 1976/01/08

## 2021-03-28 ENCOUNTER — Ambulatory Visit: Payer: 59

## 2021-03-28 ENCOUNTER — Other Ambulatory Visit: Payer: Self-pay

## 2021-03-28 DIAGNOSIS — R269 Unspecified abnormalities of gait and mobility: Secondary | ICD-10-CM | POA: Diagnosis not present

## 2021-03-28 DIAGNOSIS — R262 Difficulty in walking, not elsewhere classified: Secondary | ICD-10-CM

## 2021-03-28 DIAGNOSIS — M6281 Muscle weakness (generalized): Secondary | ICD-10-CM

## 2021-03-28 DIAGNOSIS — R2681 Unsteadiness on feet: Secondary | ICD-10-CM

## 2021-03-28 NOTE — Therapy (Signed)
[place] MAIN [facility] SERVICES [address] [place], [place], [address] Phone: [phone]   Fax:  [phone]  Physical Therapy Treatment  Patient Details  Name: Brenda Savage MRN: [id number] Date of Birth: [birth year] Referring Provider (PT): Theophilus Bones MD   Encounter Date: 03/28/2021   PT End of Session - 03/28/21 1511     Visit Number 6    Number of Visits 20    Date for PT Re-Evaluation 05/10/21    Progress Note Due on Visit 10    PT Start Time 1503    PT Stop Time 7342    PT Time Calculation (min) 45 min    Equipment Utilized During Treatment Gait belt    Activity Tolerance Patient tolerated treatment well    Behavior During Therapy WFL for tasks assessed/performed             Past Medical History:  Diagnosis Date   Anemia    Anxiety    Chest pain     Past Surgical History:  Procedure Laterality Date   CHOLECYSTECTOMY     DILATION AND CURETTAGE OF UTERUS      There were no vitals filed for this visit.   Subjective Assessment - 03/28/21 1509     Subjective Patient reports fatigued as she has a long walk into clinic today. Reports 5/10    Patient is accompained by: Family member    Limitations Standing;Walking;House hold activities    How long can you stand comfortably? 30 min or so    How long can you walk comfortably? around the house, approximately 75 feet    Patient Stated Goals improve balance, endurance, leg strength, an dwalking without any devices    Currently in Pain? Yes    Pain Score 5     Pain Location Ankle    Pain Orientation Left;Anterior    Pain Descriptors / Indicators Aching;Sore    Pain Type Chronic pain    Pain Onset More than a month ago              BP= 117/77 mmHg Left UE      INTERVENTIONS:     Prostretch calf stretch Hold 30 sec x 4 sets BLE (simultaneously)   Seated ankle EV stretch with towel ( VC and visual demo for correct technique) - Hold 30 sec x 4 sets      Long sit - Calf stretch- Hold 30 sec x 3 sets (patient reported a good stretch)   Ankle/toe towel crunches - x 30 sec x 2 sets- VC for correct technique- Patient reported "mild soreness along anterior ankle      Strengthening with Red Theraband: Ankle DF Ankle PF Ankle EV Ankle IV 2 sets of 12 reps each with Education provided throughout session via VC/TC and demonstration to facilitate movement at target joints and correct muscle activation for all testing and exercises performed. Patient demo fatigue overall with all ankle strengthening    Manual therapy:  -grade II-III talocrual mobs 30 bouts x 4 sets -PROM to left ankle  - Gentle ankle circles- CW and CCW x 20 reps each    *Patient reports the circles felt pretty good. Otherwise reported continued soreness specifically with PF combined with IV                             PT Education - 03/28/21 1555     Education Details Exercise technique  Person(s) Educated Patient    Methods Explanation;Demonstration;Tactile cues;Verbal cues    Comprehension Verbalized understanding              PT Short Term Goals - 03/02/21 0747       PT SHORT TERM GOAL #1   Title Patient will be independent in home exercise program to improve strength/mobility for better functional independence with ADLs.    Baseline No formal HEP at this time    Time 4    Period Weeks    Status New    Target Date 03/30/21      PT SHORT TERM GOAL #2   Title Patient will increase FOTO score to equal to or greater than  40   to demonstrate statistically significant improvement in mobility and quality of life.    Baseline FOTO 34    Time 6    Period Weeks    Status New    Target Date 04/13/21               PT Long Term Goals - 03/02/21 0750       PT LONG TERM GOAL #2   Title Patient (< 55 years old) will complete five times sit to stand test in < 10 seconds indicating an increased LE strength and improved balance.     Baseline 17 s (12/14)    Time 8    Period Weeks    Status New    Target Date 04/27/21      PT LONG TERM GOAL #3   Title Patient will increase Berg Balance score by > 6 points to demonstrate decreased fall risk during functional activities.    Baseline BERG 47 12/14    Time 8    Period Weeks    Status New    Target Date 04/27/21      PT LONG TERM GOAL #4   Title Patient will increase six minute walk test distance to >1000 with LRAD for progression to community ambulator and improve gait ability    Baseline able to walk approximately 75 feet in clinic at eval    Time 12    Period Weeks    Target Date 05/25/21      PT LONG TERM GOAL #5   Title Patient will increase 10 meter walk test to >1.32m/s as to improve gait speed for better community ambulation and to reduce fall risk.    Baseline .69m/s on 12/14    Time 10    Period Weeks    Status New    Target Date 05/11/21                   Plan - 03/28/21 1512     Clinical Impression Statement Patient presents with increased ankle soreness with manual and self stretching today. She continues to be limited overall with ankle fatigue yet continues to be able to complete all requested therex with resistance. Patient will benefit from continued PT services designed to promote improved overall LE strength, dynamic balance, functional mobility to reduce risk of falling and assist in return to previous level of function and work.    Personal Factors and Comorbidities Other   No known diagnosis   Examination-Activity Limitations Carry;Locomotion Level;Squat;Stairs    Examination-Participation Restrictions Cleaning;Community Activity;Driving;Laundry;Occupation;Shop    Stability/Clinical Decision Making Evolving/Moderate complexity    Rehab Potential Good    PT Frequency 2x / week    PT Duration --   10 weeks   PT Treatment/Interventions ADLs/Self Care  Home Management;Biofeedback;Moist Heat;Gait training;Stair training;Functional  mobility training;Therapeutic activities;Therapeutic exercise;Balance training;Neuromuscular re-education;Patient/family education;Manual techniques;Passive range of motion;Dry needling;Energy conservation    PT Next Visit Plan Progress LE Strengthening; dynamic balance and gait training as appropriate.    PT Home Exercise Plan Began visit 1, (marching, STS, HR); 03/08/2021=Access Code: [email or web address]  URL: [email or web address]    Consulted and Agree with Plan of Care Patient             Patient will benefit from skilled therapeutic intervention in order to improve the following deficits and impairments:  Abnormal gait, Decreased activity tolerance, Decreased endurance, Decreased strength, Decreased balance, Difficulty walking, Impaired flexibility  Visit Diagnosis: Abnormality of gait and mobility  Difficulty in walking, not elsewhere classified  Muscle weakness (generalized)  Unsteadiness on feet     Problem List Patient Active Problem List   Diagnosis Date Noted   Paresthesia 02/13/2021   Left-sided weakness 02/13/2021   Shortness of breath 02/13/2021   Anemia 02/13/2021   Goiter, 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

## 2021-03-31 ENCOUNTER — Other Ambulatory Visit: Payer: Self-pay

## 2021-03-31 ENCOUNTER — Ambulatory Visit: Payer: 59

## 2021-03-31 DIAGNOSIS — M6281 Muscle weakness (generalized): Secondary | ICD-10-CM

## 2021-03-31 DIAGNOSIS — R269 Unspecified abnormalities of gait and mobility: Secondary | ICD-10-CM

## 2021-03-31 DIAGNOSIS — R262 Difficulty in walking, not elsewhere classified: Secondary | ICD-10-CM

## 2021-03-31 DIAGNOSIS — R2681 Unsteadiness on feet: Secondary | ICD-10-CM

## 2021-03-31 NOTE — Therapy (Signed)
[place] MAIN [facility] SERVICES [address] [place], [place], [address] Phone: [phone]   Fax:  [phone]  Physical Therapy Treatment  Patient Details  Name: Brenda Savage MRN: [id number] Date of Birth: December 17, 1975 Referring Provider (PT): Theophilus Bones MD   Encounter Date: 03/31/2021   PT End of Session - 03/31/21 1553     Visit Number 7    Number of Visits 20    Date for PT Re-Evaluation 05/10/21    Progress Note Due on Visit 10    PT Start Time 1555    PT Stop Time 1640    PT Time Calculation (min) 45 min    Equipment Utilized During Treatment Gait belt    Activity Tolerance Patient tolerated treatment well    Behavior During Therapy WFL for tasks assessed/performed             Past Medical History:  Diagnosis Date   Anemia    Anxiety    Chest pain     Past Surgical History:  Procedure Laterality Date   CHOLECYSTECTOMY     DILATION AND CURETTAGE OF UTERUS      There were no vitals filed for this visit.   Subjective Assessment - 03/31/21 1553     Subjective Patient enters clinic without an AD and states doing okay- reports pain down to 3/10 - Left lateral lower leg pain.    Patient is accompained by: Family member    Limitations Standing;Walking;House hold activities    How long can you stand comfortably? 30 min or so    How long can you walk comfortably? around the house, approximately 75 feet    Patient Stated Goals improve balance, endurance, leg strength, an dwalking without any devices    Currently in Pain? Yes    Pain Score 3     Pain Location Leg    Pain Orientation Lower    Pain Type Chronic pain    Pain Onset More than a month ago    Pain Frequency Constant    Multiple Pain Sites No               INTERVENTIONS:   Therapeutic Exercises:   Prostretch - BLE - hold 30 sec x 4 sets (No reported soreness)    Nustep= LE only for progressive LE strengthening- Level 0 for 1 min, level 3 for  1 min; level 4 for 1 min; level 6  for 1 min  Seated Ham curl- Matrix cable system- 7.5 lb B LE 2 sets x 12 reps. Patient demo no difficulty with Right LE and able to accomplish with LE with some effort.   Sit to stand  (from straight back chair) in 3 positions: 1) feet equal x 4 reps x 2 sets 2) left foot on blue airex pad out in front  x 5 reps x 2 sets 3) Right foot on blue airex pad out in front x 5 reps x 2 sets    Resistive gait using 12.5 lb (matrix cable system) Forward x 3 Backward x3 Left side x3 Right side x3 *Patient demonstrated increased fatigue yet no report of pain  Single leg stance with self ball toss at mirror x 10 tosses each leg- More difficulty with standing on left LE yet able to complete without LOB today.   Tandem standing on blue airex pad (10 tosses- ball against mirror on wall) - left leg behind then right x 2 sets each.    Education provided  throughout session via VC/TC and demonstration to facilitate movement at target joints and correct muscle activation for all testing and exercises performed.                PT Education - 03/31/21 1553     Education Details Exercise technique    Person(s) Educated Patient    Methods Explanation;Demonstration;Tactile cues;Verbal cues    Comprehension Verbalized understanding;Returned demonstration;Verbal cues required;Tactile cues required;Need further instruction              PT Short Term Goals - 03/02/21 0747       PT SHORT TERM GOAL #1   Title Patient will be independent in home exercise program to improve strength/mobility for better functional independence with ADLs.    Baseline No formal HEP at this time    Time 4    Period Weeks    Status New    Target Date 03/30/21      PT SHORT TERM GOAL #2   Title Patient will increase FOTO score to equal to or greater than  40   to demonstrate statistically significant improvement in mobility and quality of life.    Baseline FOTO 34    Time 6     Period Weeks    Status New    Target Date 04/13/21               PT Long Term Goals - 03/02/21 0750       PT LONG TERM GOAL #2   Title Patient (< 31 years old) will complete five times sit to stand test in < 10 seconds indicating an increased LE strength and improved balance.    Baseline 17 s (12/14)    Time 8    Period Weeks    Status New    Target Date 04/27/21      PT LONG TERM GOAL #3   Title Patient will increase Berg Balance score by > 6 points to demonstrate decreased fall risk during functional activities.    Baseline BERG 47 12/14    Time 8    Period Weeks    Status New    Target Date 04/27/21      PT LONG TERM GOAL #4   Title Patient will increase six minute walk test distance to >1000 with LRAD for progression to community ambulator and improve gait ability    Baseline able to walk approximately 75 feet in clinic at eval    Time 12    Period Weeks    Target Date 05/25/21      PT LONG TERM GOAL #5   Title Patient will increase 10 meter walk test to >1.70m/s as to improve gait speed for better community ambulation and to reduce fall risk.    Baseline .14m/s on 12/14    Time 10    Period Weeks    Status New    Target Date 05/11/21                   Plan - 03/31/21 1554     Clinical Impression Statement Patient performed very well today with great motivation. She was able to perform therex without report of pain and able to progress to more dynamic balance without significant difficulty. She was able to ambulate without an AD today and demonstrate good and safe mobility. Patient will benefit from continued PT services designed to promote improved overall LE strength, dynamic balance, functional mobility to reduce risk of falling and assist in return  to previous level of function and work.    Personal Factors and Comorbidities Other   No known diagnosis   Examination-Activity Limitations Carry;Locomotion Level;Squat;Stairs     Examination-Participation Restrictions Cleaning;Community Activity;Driving;Laundry;Occupation;Shop    Stability/Clinical Decision Making Evolving/Moderate complexity    Rehab Potential Good    PT Frequency 2x / week    PT Duration --   10 weeks   PT Treatment/Interventions ADLs/Self Care Home Management;Biofeedback;Moist Heat;Gait training;Stair training;Functional mobility training;Therapeutic activities;Therapeutic exercise;Balance training;Neuromuscular re-education;Patient/family education;Manual techniques;Passive range of motion;Dry needling;Energy conservation    PT Next Visit Plan Progress LE Strengthening; dynamic balance and gait training as appropriate.    PT Home Exercise Plan Began visit 1, (marching, STS, HR); 03/08/2021=Access Code: [email or web address]  URL: [email or web address]    Consulted and Agree with Plan of Care Patient             Patient will benefit from skilled therapeutic intervention in order to improve the following deficits and impairments:  Abnormal gait, Decreased activity tolerance, Decreased endurance, Decreased strength, Decreased balance, Difficulty walking, Impaired flexibility  Visit Diagnosis: Abnormality of gait and mobility  Difficulty in walking, not elsewhere classified  Muscle weakness (generalized)  Unsteadiness on feet     Problem List Patient Active Problem List   Diagnosis Date Noted   Paresthesia 02/13/2021   Left-sided weakness 02/13/2021   Shortness of breath 02/13/2021   Anemia 02/13/2021   Goiter, 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

## 2021-04-05 ENCOUNTER — Ambulatory Visit: Payer: 59

## 2021-04-05 ENCOUNTER — Other Ambulatory Visit: Payer: Self-pay

## 2021-04-05 DIAGNOSIS — R269 Unspecified abnormalities of gait and mobility: Secondary | ICD-10-CM | POA: Diagnosis not present

## 2021-04-05 DIAGNOSIS — R2681 Unsteadiness on feet: Secondary | ICD-10-CM

## 2021-04-05 DIAGNOSIS — R262 Difficulty in walking, not elsewhere classified: Secondary | ICD-10-CM

## 2021-04-05 DIAGNOSIS — M6281 Muscle weakness (generalized): Secondary | ICD-10-CM

## 2021-04-05 NOTE — Therapy (Signed)
[place] MAIN [facility] SERVICES [address] [place], [place], [address] Phone: [phone]   Fax:  [phone]  Physical Therapy Treatment  Patient Details  Name: Brenda Savage MRN: [id number] Date of Birth: 1976/01/11 Referring Provider (PT): Theophilus Bones MD   Encounter Date: 04/05/2021   PT End of Session - 04/05/21 5188     Visit Number 8    Number of Visits 20    Date for PT Re-Evaluation 05/10/21    Progress Note Due on Visit 10    PT Start Time 1555    PT Stop Time 1635    PT Time Calculation (min) 40 min    Equipment Utilized During Treatment Gait belt    Activity Tolerance Patient tolerated treatment well    Behavior During Therapy WFL for tasks assessed/performed             Past Medical History:  Diagnosis Date   Anemia    Anxiety    Chest pain     Past Surgical History:  Procedure Laterality Date   CHOLECYSTECTOMY     DILATION AND CURETTAGE OF UTERUS      There were no vitals filed for this visit.   Subjective Assessment - 04/05/21 1553     Subjective Patient reports doing well again this week and states she plans to try to return to work next week.    Patient is accompained by: Family member    Limitations Standing;Walking;House hold activities    How long can you stand comfortably? 30 min or so    How long can you walk comfortably? around the house, approximately 75 feet    Patient Stated Goals improve balance, endurance, leg strength, an dwalking without any devices    Currently in Pain? No/denies    Pain Onset More than a month ago               Therapeutic Exercises:    Prostretch - BLE - hold 30 sec x 4 sets "I love the stretch- its a good one that really stretches the calves"    Instructed in the following 2 hip exercises and added to HEP:   Sidelye Hip ER with blue Theraband (around distal thigh) Sidelye Hip ABD with blue Theraband (around distal thigh) 2 sets of 12 reps with VC for     Standing  Ham curl- Matrix cable system- 7.5 lb B LE 2 sets x 12 reps. Patient demo no difficulty with Right LE and able to accomplish with LE with some effort.      Leg press (single leg) each 2 sets of 12 reps *Patient demonstrated increased fatigue yet no report of pain   Calf press (Single leg) each 2 sets of 12 reps  Matrix cable (hip abd) at 2.5 lb (+ an added 3lb AW attached to weight system) for 5.5 lb total= 2 sets of 12 reps   Matrix cable (hip ext) at 2.5 lb (+ an added 3lb AW attached to weight system) for 5.5 lb total= 2 sets of 12 reps  Education provided throughout session via VC/TC and demonstration to facilitate movement at target joints and correct muscle activation for all testing and exercises performed.                  PT Education - 04/05/21 1553     Education Details Exercise technique    Person(s) Educated Patient    Methods Explanation;Demonstration;Tactile cues;Verbal cues    Comprehension Returned demonstration;Verbal cues  required;Verbalized understanding;Tactile cues required;Need further instruction              PT Short Term Goals - 03/02/21 0747       PT SHORT TERM GOAL #1   Title Patient will be independent in home exercise program to improve strength/mobility for better functional independence with ADLs.    Baseline No formal HEP at this time    Time 4    Period Weeks    Status New    Target Date 03/30/21      PT SHORT TERM GOAL #2   Title Patient will increase FOTO score to equal to or greater than  40   to demonstrate statistically significant improvement in mobility and quality of life.    Baseline FOTO 34    Time 6    Period Weeks    Status New    Target Date 04/13/21               PT Long Term Goals - 03/02/21 0750       PT LONG TERM GOAL #2   Title Patient (< 49 years old) will complete five times sit to stand test in < 10 seconds indicating an increased LE strength and improved balance.     Baseline 17 s (12/14)    Time 8    Period Weeks    Status New    Target Date 04/27/21      PT LONG TERM GOAL #3   Title Patient will increase Berg Balance score by > 6 points to demonstrate decreased fall risk during functional activities.    Baseline BERG 47 12/14    Time 8    Period Weeks    Status New    Target Date 04/27/21      PT LONG TERM GOAL #4   Title Patient will increase six minute walk test distance to >1000 with LRAD for progression to community ambulator and improve gait ability    Baseline able to walk approximately 75 feet in clinic at eval    Time 12    Period Weeks    Target Date 05/25/21      PT LONG TERM GOAL #5   Title Patient will increase 10 meter walk test to >1.67m/s as to improve gait speed for better community ambulation and to reduce fall risk.    Baseline .29m/s on 12/14    Time 10    Period Weeks    Status New    Target Date 05/11/21                   Plan - 04/05/21 1554     Clinical Impression Statement Patient continues to present with excellent motivation and pushes herself to improve to be work ready. She was able to progress overall with left LE strength today as seen by increased performance on Matrix cable system. She was able to progress with hip strength in varying ranges as well today. Patient will benefit from continued PT services designed to promote improved overall LE strength, dynamic balance, functional mobility to reduce risk of falling and assist in return to previous level of function and work    Personal Factors and Comorbidities Other   No known diagnosis   Examination-Activity Limitations Carry;Locomotion Level;Squat;Stairs    Examination-Participation Restrictions Cleaning;Community Activity;Driving;Laundry;Occupation;Shop    Stability/Clinical Decision Making Evolving/Moderate complexity    Rehab Potential Good    PT Frequency 2x / week    PT Duration --   10  weeks   PT Treatment/Interventions ADLs/Self Care Home  Management;Biofeedback;Moist Heat;Gait training;Stair training;Functional mobility training;Therapeutic activities;Therapeutic exercise;Balance training;Neuromuscular re-education;Patient/family education;Manual techniques;Passive range of motion;Dry needling;Energy conservation    PT Next Visit Plan Progress LE Strengthening; dynamic balance and gait training as appropriate.    PT Home Exercise Plan Began visit 1, (marching, STS, HR); 03/08/2021=Access Code: [email or web address]  URL: [email or web address]    Consulted and Agree with Plan of Care Patient             Patient will benefit from skilled therapeutic intervention in order to improve the following deficits and impairments:  Abnormal gait, Decreased activity tolerance, Decreased endurance, Decreased strength, Decreased balance, Difficulty walking, Impaired flexibility  Visit Diagnosis: Abnormality of gait and mobility  Difficulty in walking, not elsewhere classified  Muscle weakness (generalized)  Unsteadiness on feet     Problem List Patient Active Problem List   Diagnosis Date Noted   Paresthesia 02/13/2021   Left-sided weakness 02/13/2021   Shortness of breath 02/13/2021   Anemia 02/13/2021   Goiter, 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 [place], [place], [address] Phone: [phone]   Fax:  [phone]  Name: Brenda Savage MRN: [id number] Date of Birth: Jun 01, 1975

## 2021-04-07 ENCOUNTER — Ambulatory Visit: Payer: 59

## 2021-04-07 ENCOUNTER — Other Ambulatory Visit: Payer: Self-pay

## 2021-04-07 DIAGNOSIS — R269 Unspecified abnormalities of gait and mobility: Secondary | ICD-10-CM

## 2021-04-07 DIAGNOSIS — R262 Difficulty in walking, not elsewhere classified: Secondary | ICD-10-CM

## 2021-04-07 DIAGNOSIS — R2681 Unsteadiness on feet: Secondary | ICD-10-CM

## 2021-04-07 DIAGNOSIS — M6281 Muscle weakness (generalized): Secondary | ICD-10-CM

## 2021-04-07 NOTE — Therapy (Signed)
[place] MAIN [facility] SERVICES [address] [place], [place], [address] Phone: [phone]   Fax:  [phone]  Physical Therapy Treatment  Patient Details  Name: Brenda Savage MRN: [id number] Date of Birth: [birth year] Referring Provider (PT): Theophilus Bones MD   Encounter Date: 04/07/2021   PT End of Session - 04/07/21 1551     Visit Number 9    Number of Visits 20    Date for PT Re-Evaluation 05/10/21    Progress Note Due on Visit 10    PT Start Time 1555    PT Stop Time 1640    PT Time Calculation (min) 45 min    Equipment Utilized During Treatment Gait belt    Activity Tolerance Patient tolerated treatment well    Behavior During Therapy WFL for tasks assessed/performed             Past Medical History:  Diagnosis Date   Anemia    Anxiety    Chest pain     Past Surgical History:  Procedure Laterality Date   CHOLECYSTECTOMY     DILATION AND CURETTAGE OF UTERUS      There were no vitals filed for this visit.   Subjective Assessment - 04/07/21 1551     Patient is accompained by: Family member    Limitations Standing;Walking;House hold activities    How long can you stand comfortably? 30 min or so    How long can you walk comfortably? around the house, approximately 75 feet    Patient Stated Goals improve balance, endurance, leg strength, an dwalking without any devices    Pain Onset More than a month ago             Therapeutic Exercises:    Prostretch - BLE - hold 30 sec x 4 sets      Biodex TM (Gait trainer x 4 min) - amb index score= 91% at 1.0 m/s; Time on each foot = 50/50   Reviewed 2 hip exercises 2 sets of 12 reps with VC: Sidelye Hip ER with greenTheraband (around distal thigh) Sidelye Hip ABD with Green Theraband (around distal thigh)   Toe walk/heel walk in // bars x 2 trials each with no difficulty or pain.   Ladder drills- forward then backward walking (1 foot into one square for  reciprocal steps) x 4 trials each- No significant difficulty.   Ascend/descend 12 steps with reciprocal steps without difficulty or report of pain.   Heel raises on 1/2 foam roll x 12 reps- No pain or difficutly Toe raises on 1/2 foam roll x 12 reps - No pain or difficulty     Leg press (single leg) each 2 sets of 12 reps at 25 lb each         Education provided throughout session via VC/TC and demonstration to facilitate movement at target joints and correct muscle activation for all testing and exercises performed.                  Reviewed Hip strengthening from last visit:   Clamshell BLE x 10 reps with   Access Code: [email or web address] URL: [email or web address] Date: 04/07/2021 Prepared by: Sande Brothers  Exercises Sidelying Hip Abduction with Resistance at Thighs - 1 x daily - 7 x weekly - 3 sets - 10 reps Clam with Resistance - 1 x daily - 7 x weekly - 3 sets - 10 reps   FOTO= 73  PT Education - 04/07/21 1551     Education Details Exercise technique    Person(s) Educated Patient    Methods Explanation;Demonstration;Tactile cues;Verbal cues    Comprehension Verbalized understanding;Returned demonstration;Verbal cues required;Tactile cues required;Need further instruction              PT Short Term Goals - 03/02/21 0747       PT SHORT TERM GOAL #1   Title Patient will be independent in home exercise program to improve strength/mobility for better functional independence with ADLs.    Baseline No formal HEP at this time    Time 4    Period Weeks    Status New    Target Date 03/30/21      PT SHORT TERM GOAL #2   Title Patient will increase FOTO score to equal to or greater than  40   to demonstrate statistically significant improvement in mobility and quality of life.    Baseline FOTO 34    Time 6    Period Weeks    Status New    Target Date 04/13/21               PT Long Term Goals - 04/07/21  1657       PT LONG TERM GOAL #2   Title Patient (< 80 years old) will complete five times sit to stand test in < 10 seconds indicating an increased LE strength and improved balance.    Baseline 17 s (12/14)    Time 8    Period Weeks    Status New    Target Date 04/27/21      PT LONG TERM GOAL #3   Title Patient will increase Berg Balance score by > 6 points to demonstrate decreased fall risk during functional activities.    Baseline BERG 47 12/14    Time 8    Period Weeks    Status New    Target Date 04/27/21      PT LONG TERM GOAL #4   Title Patient will increase six minute walk test distance to >1000 with LRAD for progression to community ambulator and improve gait ability    Baseline able to walk approximately 75 feet in clinic at eval    Time 12    Period Weeks    Target Date 05/25/21      PT LONG TERM GOAL #5   Title Patient will increase 10 meter walk test to >1.100m/s as to improve gait speed for better community ambulation and to reduce fall risk.    Baseline .60m/s on 12/14    Time 10    Period Weeks    Status New    Target Date 05/11/21                   Plan - 04/07/21 1552     Clinical Impression Statement Patient presented with excellent motivation today and performed well with single leg strengthening, coordination/balance activities without significant difficulty and no loss of balance. Patient improved with good understanding of hip strengthening and scored much higher on her FOTO score indicating a significant improvement in her self perceived abilities. Plan for patient to try to return to work on Monday and patient to call sometime next week to state how she is doing and return as scheduled if needed.    Personal Factors and Comorbidities Other   No known diagnosis   Examination-Activity Limitations Carry;Locomotion Level;Squat;Stairs    Examination-Participation Restrictions Cleaning;Community Activity;Driving;Laundry;Occupation;Shop  Stability/Clinical Decision Making Evolving/Moderate complexity    Rehab Potential Good    PT Frequency 2x / week    PT Duration --   10 weeks   PT Treatment/Interventions ADLs/Self Care Home Management;Biofeedback;Moist Heat;Gait training;Stair training;Functional mobility training;Therapeutic activities;Therapeutic exercise;Balance training;Neuromuscular re-education;Patient/family education;Manual techniques;Passive range of motion;Dry needling;Energy conservation    PT Next Visit Plan Progress LE Strengthening; dynamic balance and gait training as appropriate.    PT Home Exercise Plan Began visit 1, (marching, STS, HR); 03/08/2021=Access Code: [email or web address]  URL: [email or web address]    Consulted and Agree with Plan of Care Patient             Patient will benefit from skilled therapeutic intervention in order to improve the following deficits and impairments:  Abnormal gait, Decreased activity tolerance, Decreased endurance, Decreased strength, Decreased balance, Difficulty walking, Impaired flexibility  Visit Diagnosis: Abnormality of gait and mobility  Difficulty in walking, not elsewhere classified  Muscle weakness (generalized)  Unsteadiness on feet     Problem List Patient Active Problem List   Diagnosis Date Noted   Paresthesia 02/13/2021   Left-sided weakness 02/13/2021   Shortness of breath 02/13/2021   Anemia 02/13/2021   Goiter, 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

## 2021-04-20 NOTE — Therapy (Signed)
[place] MAIN [facility] SERVICES [address] [place], [place], [address] Phone: [phone]   Fax:  [phone]  April 20, 2021   No Recipients  Physical Therapy Discharge Summary  Patient: Brenda Savage  MRN: [id number]  Date of Birth: [birth year]   Diagnosis: No diagnosis found. Referring Provider (PT): Theophilus Bones MD   The above patient had been seen in Physical Therapy 9  times of 11 treatments scheduled with 0 no shows and 2 cancellations.  The treatment consisted of Therapeutic exercise and neuro re-education interventions to improve her functional Strength and mobility The patient is: Improved  Subjective: Called patient to discuss her return to work last week and she reports she is doing well- No falls or LOB- Her biggest complaint with work is fatigue and some knee soreness but states she is easing back into her daily tasks and performing well. She stated she did not feel like she required any further services at this time.   Discharge Findings: Patient reports and presents with return to independence including returning to work last week.   Functional Status at Discharge: Unable to formally assess as patient self-discharged over the phone but was doing well with all PT activities.       Sincerely,   Lewis Moccasin, PT   CC No Recipients  [place] MAIN [facility] SERVICES [address] [place], [place], [address] Phone: [phone]   Fax:  [phone]  Patient: Angeleena Dueitt  MRN: [id number]  Date of Birth: [birth year]

## 2021-04-25 ENCOUNTER — Ambulatory Visit: Payer: 59 | Admitting: Physical Therapy

## 2021-04-27 ENCOUNTER — Ambulatory Visit: Payer: 59 | Admitting: Physical Therapy

## 2021-05-03 ENCOUNTER — Ambulatory Visit: Payer: 59 | Admitting: Physical Therapy

## 2021-05-06 ENCOUNTER — Ambulatory Visit: Payer: 59

## 2021-05-10 ENCOUNTER — Ambulatory Visit: Payer: 59 | Admitting: Physical Therapy

## 2021-05-13 ENCOUNTER — Ambulatory Visit: Payer: 59

## 2021-05-17 ENCOUNTER — Ambulatory Visit: Payer: 59

## 2021-05-20 ENCOUNTER — Ambulatory Visit: Payer: 59

## 2021-05-23 ENCOUNTER — Ambulatory Visit: Payer: 59

## 2021-05-25 ENCOUNTER — Ambulatory Visit: Payer: 59 | Admitting: Physical Therapy

## 2021-05-27 ENCOUNTER — Ambulatory Visit: Payer: 59

## 2021-05-30 ENCOUNTER — Ambulatory Visit: Payer: 59 | Admitting: Physical Therapy

## 2021-06-03 ENCOUNTER — Ambulatory Visit: Payer: 59

## 2021-06-07 ENCOUNTER — Ambulatory Visit: Payer: 59

## 2021-06-10 ENCOUNTER — Ambulatory Visit: Payer: 59

## 2021-06-14 ENCOUNTER — Ambulatory Visit: Payer: 59

## 2021-06-17 ENCOUNTER — Ambulatory Visit: Payer: 59

## 2021-06-22 ENCOUNTER — Ambulatory Visit: Payer: 59 | Admitting: Physical Therapy

## 2021-06-24 ENCOUNTER — Ambulatory Visit: Payer: 59

## 2021-06-29 ENCOUNTER — Ambulatory Visit: Payer: 59 | Admitting: Physical Therapy

## 2021-07-01 ENCOUNTER — Ambulatory Visit: Payer: 59

## 2021-07-06 ENCOUNTER — Ambulatory Visit: Payer: 59 | Admitting: Physical Therapy

## 2021-07-08 ENCOUNTER — Ambulatory Visit: Payer: 59

## 2021-07-13 ENCOUNTER — Ambulatory Visit: Payer: 59 | Admitting: Physical Therapy

## 2021-07-15 ENCOUNTER — Ambulatory Visit: Payer: 59

## 2021-07-20 ENCOUNTER — Ambulatory Visit: Payer: 59 | Admitting: Physical Therapy

## 2021-07-22 ENCOUNTER — Ambulatory Visit: Payer: 59

## 2021-07-27 ENCOUNTER — Ambulatory Visit: Payer: 59 | Admitting: Physical Therapy

## 2021-07-29 ENCOUNTER — Ambulatory Visit: Payer: 59

## 2021-08-03 ENCOUNTER — Ambulatory Visit: Payer: 59 | Admitting: Physical Therapy

## 2021-08-05 ENCOUNTER — Ambulatory Visit: Payer: 59

## 2021-09-26 ENCOUNTER — Ambulatory Visit
Admission: EM | Admit: 2021-09-26 | Discharge: 2021-09-26 | Disposition: A | Discharge status: Home / Self Care | Payer: 59

## 2021-09-26 DIAGNOSIS — H6121 Impacted cerumen, right ear: Secondary | ICD-10-CM

## 2021-09-26 NOTE — ED Provider Notes (Signed)
[facility]    CSN: [id number] Arrival date & time: 09/26/21  1653      History   Chief Complaint Chief Complaint  Patient presents with   Ear Fullness    Right     HPI Brenda Savage is a 46 y.o. female.   HPI  59 female here for evaluation of right ear pressure.  Patient reports that she initially had pressure in her left ear but that is resolved.  Today while at work she felt pressure in her right ear so she used a Q-tip and removed a bunch of wax.  Following that attempt electrical she developed pressure in her ear and an inability to hear clearly.  She does not not have any runny nose, nasal congestion, or drainage from the ear.  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      Home Medications    Prior to Admission medications   Medication Sig Start Date End Date Taking? Authorizing Provider  ALPRAZolam Duanne Moron) 1 MG tablet Take by mouth. 02/17/21  Yes [provider]  ibuprofen (ADVIL) 600 MG tablet Take by mouth. 09/13/21  Yes [provider]  Multiple Vitamin (MULTI-VITAMIN) tablet Take 1 tablet by mouth daily. 03/23/21 03/23/22 Yes [provider]  norethindrone (AYGESTIN) 5 MG tablet Take 5 mg by mouth daily. 08/23/21  Yes [provider]  sertraline (ZOLOFT) 50 MG tablet Take 1 tablet by mouth daily. 08/16/21 08/16/22 Yes [provider]    Family History Family History  Problem Relation Age of Onset   Hypertension Mother    Diabetes Mother    Atrial fibrillation Mother    Hypertension Father     Social  History Social History   Tobacco Use   Smoking status: Never   Smokeless tobacco: Never  Substance Use Topics   Alcohol use: Not Currently   Drug use: Not Currently     Allergies   Patient has no known allergies.   Review of Systems Review of Systems  Constitutional:  Negative for fever.  HENT:  Positive for hearing loss. Negative for congestion, ear discharge, ear pain and rhinorrhea.      Physical Exam Triage Vital Signs ED Triage Vitals  Enc Vitals Group     BP 09/26/21 1749 (!) 135/91     Pulse Rate 09/26/21 1749 77     Resp --      Temp 09/26/21 1749 98.4 F (36.9 C)     Temp Source 09/26/21 1749 Oral     SpO2 09/26/21 1749 100 %     Weight 09/26/21 1747 183 lb (83 kg)     Height 09/26/21 1747 5' 5.5" (1.664 m)     Head Circumference --      Peak Flow --      Pain Score 09/26/21 1747 0     Pain Loc --      Pain Edu? --      Excl. in [place]? --    No data found.  Updated  Vital Signs BP (!) 135/91 (BP Location: Right Arm)   Pulse 77   Temp 98.4 F (36.9 C) (Oral)   Ht 5' 5.5" (1.664 m)   Wt 183 lb (83 kg)   LMP 09/13/2021 (Exact Date)   SpO2 100%   BMI 29.99 kg/m   Visual Acuity Right Eye Distance:   Left Eye Distance:   Bilateral Distance:    Right Eye Near:   Left Eye Near:    Bilateral Near:     Physical Exam Vitals and nursing note reviewed.  Constitutional:      Appearance: Normal appearance. She is not ill-appearing.  HENT:     Head: Normocephalic and atraumatic.     Right Ear: External ear normal. There is impacted cerumen.     Left Ear: Tympanic membrane, ear canal and external ear normal. There is no impacted cerumen.  Skin:    General: Skin is warm and dry.     Capillary Refill: Capillary refill takes less than 2 seconds.     Findings: No erythema or rash.  Neurological:     General: No focal deficit present.     Mental Status: She is alert and oriented to person, place, and time.  Psychiatric:        Mood and Affect: Mood  normal.        Behavior: Behavior normal.        Thought Content: Thought content normal.        Judgment: Judgment normal.      UC Treatments / Results  Labs (all labs ordered are listed, but only abnormal results are displayed) Labs Reviewed - No data to display  EKG   Radiology No results found.  Procedures Procedures (including critical care time)  Medications Ordered in UC Medications - No data to display  Initial Impression / Assessment and Plan / UC Course  I have reviewed the triage vital signs and the nursing notes.  Pertinent labs & imaging results that were available during my care of the patient were reviewed by me and considered in my medical decision making (see chart for details).  Patient is a very pleasant, nontoxic-appearing 46 year old female here for evaluation of right ear pressure and decreased hearing that started this afternoon.  On exam patient has cerumen impaction in her right ear.  The left external auditory canal is clear and her tympanic membrane on the left is pearly gray in appearance with normal light reflex.  I will order a lavage of the right ear and reevaluate after completion.  Lavage was successful for large volume of brown wax.  Reassessment reveals a pearly-gray tympanic membrane with normal light reflex.  The external auditory canal is now clear.  Patient reports her symptoms have resolved.   Final Clinical Impressions(s) / UC Diagnoses   Final diagnoses:  Hearing loss of right ear due to cerumen impaction     Discharge Instructions      Please return if you have any recurrence of symptoms.  Avoid using Q-tips in the future.     ED Prescriptions   None    PDMP not reviewed this encounter.   Margarette Canada, NP 09/26/21 1825

## 2021-09-26 NOTE — ED Triage Notes (Signed)
Patient present to UC for Right ear pressure.   "It feels like there is something in it"   Patient reports it started yesterday

## 2021-09-26 NOTE — Discharge Instructions (Addendum)
Please return if you have any recurrence of symptoms.  Avoid using Q-tips in the future.

## 2022-11-26 ENCOUNTER — Other Ambulatory Visit: Payer: Self-pay

## 2022-11-26 ENCOUNTER — Ambulatory Visit (INDEPENDENT_AMBULATORY_CARE_PROVIDER_SITE_OTHER): Payer: 59

## 2022-11-26 ENCOUNTER — Ambulatory Visit
Admission: EM | Admit: 2022-11-26 | Discharge: 2022-11-26 | Disposition: A | Discharge status: Home / Self Care | Payer: 59 | Attending: Family Medicine | Admitting: Family Medicine

## 2022-11-26 ENCOUNTER — Emergency Department
Admission: EM | Admit: 2022-11-26 | Discharge: 2022-11-26 | Disposition: A | Discharge status: Home / Self Care | Payer: 59 | Attending: Emergency Medicine | Admitting: Emergency Medicine

## 2022-11-26 ENCOUNTER — Encounter: Payer: Self-pay | Admitting: Emergency Medicine

## 2022-11-26 ENCOUNTER — Emergency Department: Payer: 59

## 2022-11-26 DIAGNOSIS — T192XXA Foreign body in vulva and vagina, initial encounter: Secondary | ICD-10-CM

## 2022-11-26 DIAGNOSIS — R1032 Left lower quadrant pain: Secondary | ICD-10-CM

## 2022-11-26 DIAGNOSIS — R102 Pelvic and perineal pain: Secondary | ICD-10-CM | POA: Diagnosis not present

## 2022-11-26 DIAGNOSIS — N94819 Vulvodynia, unspecified: Secondary | ICD-10-CM | POA: Diagnosis not present

## 2022-11-26 LAB — POC URINE PREG, ED: Preg Test, Ur: NEGATIVE

## 2022-11-26 LAB — URINALYSIS, W/ REFLEX TO CULTURE (INFECTION SUSPECTED)
Bilirubin Urine: NEGATIVE
Glucose, UA: NEGATIVE mg/dL
Ketones, ur: NEGATIVE mg/dL
Leukocytes,Ua: NEGATIVE
Nitrite: NEGATIVE
Protein, ur: NEGATIVE mg/dL
Specific Gravity, Urine: 1.023 (ref 1.005–1.030)
pH: 5 (ref 5.0–8.0)

## 2022-11-26 LAB — CBC WITH DIFFERENTIAL/PLATELET
Abs Immature Granulocytes: 0.02 10*3/uL (ref 0.00–0.07)
Basophils Absolute: 0 10*3/uL (ref 0.0–0.1)
Basophils Relative: 1 %
Eosinophils Absolute: 0.1 10*3/uL (ref 0.0–0.5)
Eosinophils Relative: 2 %
HCT: 36 % (ref 36.0–46.0)
Hemoglobin: 11.8 g/dL — ABNORMAL LOW (ref 12.0–15.0)
Immature Granulocytes: 0 %
Lymphocytes Relative: 45 %
Lymphs Abs: 2 10*3/uL (ref 0.7–4.0)
MCH: 26.9 pg (ref 26.0–34.0)
MCHC: 32.8 g/dL (ref 30.0–36.0)
MCV: 82 fL (ref 80.0–100.0)
Monocytes Absolute: 0.4 10*3/uL (ref 0.1–1.0)
Monocytes Relative: 9 %
Neutro Abs: 2 10*3/uL (ref 1.7–7.7)
Neutrophils Relative %: 43 %
Platelets: 273 10*3/uL (ref 150–400)
RBC: 4.39 MIL/uL (ref 3.87–5.11)
RDW: 14.3 % (ref 11.5–15.5)
WBC: 4.5 10*3/uL (ref 4.0–10.5)
nRBC: 0 % (ref 0.0–0.2)

## 2022-11-26 LAB — WET PREP, GENITAL
Clue Cells Wet Prep HPF POC: NONE SEEN
Sperm: NONE SEEN
Trich, Wet Prep: NONE SEEN
WBC, Wet Prep HPF POC: <10 (ref <10)
Yeast Wet Prep HPF POC: NONE SEEN

## 2022-11-26 LAB — BASIC METABOLIC PANEL
Anion gap: 7 (ref 5–15)
BUN: 15 mg/dL (ref 6–20)
CO2: 25 mmol/L (ref 22–32)
Calcium: 9.5 mg/dL (ref 8.9–10.3)
Chloride: 106 mmol/L (ref 98–111)
Creatinine, Ser: 0.82 mg/dL (ref 0.44–1.00)
GFR, Estimated: >60 mL/min (ref >60)
Glucose, Bld: 122 mg/dL — ABNORMAL HIGH (ref 70–99)
Potassium: 3.6 mmol/L (ref 3.5–5.1)
Sodium: 138 mmol/L (ref 135–145)

## 2022-11-26 NOTE — ED Triage Notes (Signed)
Pt in via POV, reports ongoing vaginal pain/pressure since last night, reports some spotting, denies any abnormal discharge.  Was previously concerned about IUD placement due to pain, states she had imaging done today at [facility], IUD in place per that imaging.  Also reports some lower right abdominal pain.  Ambulatory to triage, NAD noted at this time.

## 2022-11-26 NOTE — ED Provider Triage Note (Signed)
Emergency Medicine Provider Triage Evaluation Note  Brenda Savage , a 47 y.o. female  was evaluated in triage.  Pt complains of pelvic pain, and vaginal pressure since last night. Thinks "something collapsed." Thought her IUD was misplaced but had XR at [facility] and it was normal. No history of prolapse. Had two vaginal deliveries. No discharge, has vaginal spotting.  Also has intermittent lower Right abdominal pain. Had a pelvic exam at UC, and didn't see anything.   Review of Systems  Positive: Vaginal "pressure" and spotting  Negative: Fever, abd pain, n/v, dysuria  Physical Exam  There were no vitals taken for this visit. Gen:   Awake, no distress   Resp:  Normal effort  MSK:   Moves extremities without difficulty  Other:    Medical Decision Making  Medically screening exam initiated at 2:50 PM.  Appropriate orders placed.  Brenda Savage was informed that the remainder of the evaluation will be completed by another provider, this initial triage assessment does not replace that evaluation, and the importance of remaining in the ED until their evaluation is complete.     Jackelyn Hoehn, PA-C 11/26/22 1451

## 2022-11-26 NOTE — ED Provider Notes (Signed)
[facility] Emergency Department Provider Note     Event Date/Time   First MD Initiated Contact with Patient 11/26/22 1752     (approximate)   History   Vaginal Pain   HPI  Brenda Savage is a 47 y.o. female presents to the ED after evaluation at [facility].  Patient had concerns for some vaginal pain and pressure at the introitus since last night.  She initially has some concern for possible retained tampon.  She denies any normal vaginal bleeding, vaginal discharge, or concerns for STD.  Patient notes that the urgent care was able to perform an x-ray which showed normal placement of her IUD.  She presents to the ED for pelvic exam noting that the provider at the urgent care was unable to localize her cervix.  Physical Exam   Triage Vital Signs: ED Triage Vitals  Encounter Vitals Group     BP 11/26/22 1503 (!) 141/95     Systolic BP Percentile --      Diastolic BP Percentile --      Pulse Rate 11/26/22 1503 74     Resp 11/26/22 1503 18     Temp 11/26/22 1503 98.2 F (36.8 C)     Temp Source 11/26/22 1503 Oral     SpO2 11/26/22 1503 98 %     Weight 11/26/22 1450 196 lb (88.9 kg)     Height 11/26/22 1450 5\' 5"  (1.651 m)     Head Circumference --      Peak Flow --      Pain Score 11/26/22 1449 2     Pain Loc --      Pain Education --      Exclude from Growth Chart --     Most recent vital signs: Vitals:   11/26/22 1503 11/26/22 1930  BP: (!) 141/95 129/80  Pulse: 74 70  Resp: 18 16  Temp: 98.2 F (36.8 C)   SpO2: 98% 99%    General Awake, no distress. NAD HEENT NCAT. PERRL. EOMI. No rhinorrhea. Mucous membranes are moist.  CV:  Good peripheral perfusion.  RESP:  Normal effort.  ABD:  No distention.  GU:  Normal external genitalia.  Scant blood in the vault.  Redundancy of the vaginal walls without clear visualization of the cervix.  The IUD strings however, could be seen at the 11 o'clock position.  Questionable area of the  vaginal wall at the 9 o'clock position for possible hematoma versus local trauma.  No adnexal masses appreciated.  Palpable cervix on bimanual exam.   ED Results / Procedures / Treatments   Labs (all labs ordered are listed, but only abnormal results are displayed) Labs Reviewed  BASIC METABOLIC PANEL - Abnormal; Notable for the following components:      Result Value   Glucose, Bld 122 (*)    All other components within normal limits  CBC WITH DIFFERENTIAL/PLATELET - Abnormal; Notable for the following components:   Hemoglobin 11.8 (*)    All other components within normal limits  URINALYSIS, W/ REFLEX TO CULTURE (INFECTION SUSPECTED) - Abnormal; Notable for the following components:   Color, Urine YELLOW (*)    APPearance CLEAR (*)    Hgb urine dipstick LARGE (*)    Bacteria, UA RARE (*)    All other components within normal limits  WET PREP, GENITAL  POC URINE PREG, ED     EKG   RADIOLOGY  I personally viewed and evaluated these images as  part of my medical decision making, as well as reviewing the written report by the radiologist.  ED Provider Interpretation: No acute findings  US Pelvis Complete  Result Date: 11/26/2022 CLINICAL DATA:  Pelvic pressure for 2 days. EXAM: TRANSABDOMINAL AND TRANSVAGINAL ULTRASOUND OF PELVIS TECHNIQUE: Both transabdominal and transvaginal ultrasound examinations of the pelvis were performed. Transabdominal technique was performed for global imaging of the pelvis including uterus, ovaries, adnexal regions, and pelvic cul-de-sac. It was necessary to proceed with endovaginal exam following the transabdominal exam to visualize the endometrium. COMPARISON:  None Available. FINDINGS: Uterus Measurements: 9.8 x 4.9 x 5.3 cm = volume: 132 mL. No fibroids or other mass visualized. IUD is appropriately position. Endometrium Thickness: 7 mm, within normal limits. No focal abnormality visualized. Right ovary Measurements: 3.3 x 1.7 x 2.4 cm = volume: 7 mL.  Normal appearance/no adnexal mass. Left ovary Measurements: 3.2 x 1.9 x 2.5 cm = volume: 7 mL. Normal appearance/no adnexal mass. Other findings No abnormal free fluid. IMPRESSION: Normal pelvic ultrasound. IUD is appropriately position. Electronically Signed   By: Marin Roberts M.D.   On: 11/26/2022 16:47   US Transvaginal Non-OB  Result Date: 11/26/2022 CLINICAL DATA:  Pelvic pressure for 2 days. EXAM: TRANSABDOMINAL AND TRANSVAGINAL ULTRASOUND OF PELVIS TECHNIQUE: Both transabdominal and transvaginal ultrasound examinations of the pelvis were performed. Transabdominal technique was performed for global imaging of the pelvis including uterus, ovaries, adnexal regions, and pelvic cul-de-sac. It was necessary to proceed with endovaginal exam following the transabdominal exam to visualize the endometrium. COMPARISON:  None Available. FINDINGS: Uterus Measurements: 9.8 x 4.9 x 5.3 cm = volume: 132 mL. No fibroids or other mass visualized. IUD is appropriately position. Endometrium Thickness: 7 mm, within normal limits. No focal abnormality visualized. Right ovary Measurements: 3.3 x 1.7 x 2.4 cm = volume: 7 mL. Normal appearance/no adnexal mass. Left ovary Measurements: 3.2 x 1.9 x 2.5 cm = volume: 7 mL. Normal appearance/no adnexal mass. Other findings No abnormal free fluid. IMPRESSION: Normal pelvic ultrasound. IUD is appropriately position. Electronically Signed   By: Marin Roberts M.D.   On: 11/26/2022 16:47   DG Abd 1 View  Result Date: 11/26/2022 CLINICAL DATA:  IUD localization. EXAM: ABDOMEN - 1 VIEW COMPARISON:  None Available. FINDINGS: The bowel gas pattern is normal. IUD is seen in the central pelvis. Upper quadrant surgical clips are also seen from prior cholecystectomy. IMPRESSION: IUD in central pelvis.  Normal bowel gas pattern. Electronically Signed   By: Danae Orleans M.D.   On: 11/26/2022 13:30     PROCEDURES:  Critical Care performed: No  Procedures   MEDICATIONS  ORDERED IN ED: Medications - No data to display   IMPRESSION / MDM / ASSESSMENT AND PLAN / ED COURSE  I reviewed the triage vital signs and the nursing notes.                              Differential diagnosis includes, but is not limited to, vaginal prolapse, bladder prolapse, vulvodynia, vaginitis, cervicitis or vaginal trauma  Patient's presentation is most consistent with acute complicated illness / injury requiring diagnostic workup.  Patient's diagnosis is consistent with COVID any of an unclear etiology.  Poor visualization of the cervix on speculum exam due to redundancy of cervical wall however no evidence clinically or on ultrasound of vaginal prolapse.  IUD confirmed in place with vaginal visualization of the IUD strings.  Labs negative at this time.  Patient is to follow up with her GYN as discussed, as needed or otherwise directed. Patient is given ED precautions to return to the ED for any worsening or new symptoms.  FINAL CLINICAL IMPRESSION(S) / ED DIAGNOSES   Final diagnoses:  Vulvodynia     Rx / DC Orders   ED Discharge Orders     None        Note:  This document was prepared using Dragon voice recognition software and may include unintentional dictation errors.    Lissa Hoard, PA-C 11/27/22 Raenette Rover, MD 11/27/22 1240

## 2022-11-26 NOTE — Discharge Instructions (Signed)
Your exam, labs, and ultrasound normal reassuring at this time.  No evidence of retained vaginal foreign body (tampon), and no evidence of acute vaginitis.  You should follow-up with your primary GYN for ongoing evaluation management.

## 2022-11-26 NOTE — ED Triage Notes (Signed)
Pt states she had a tampon on & used the bathroom. Doesn't remember if she took her old one out & put another in. States also has an IUD & is unsure if IUD is coming out or 2 tampons are inside vagina.

## 2022-11-26 NOTE — ED Provider Notes (Signed)
[facility]    CSN: [id number] Arrival date & time: 11/26/22  1058      History   Chief Complaint Chief Complaint  Patient presents with   Vaginal Problem     HPI HPI Brenda Savage is a 47 y.o. female here for vaginal concern. She doesn't recall removing her tampon but put another one in.  Feels fullness and feels strings. Has intermittent RLQ pain that is sharp. Had IUD placed July 2023. Has had persistent spotting since then.  No vaginal discharge, fever, chills, back pain. Nothing taken for pain.          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      Home Medications    Prior to Admission medications   Medication Sig Start Date End Date Taking? Authorizing Provider  ALPRAZolam Prudy Feeler) 1 MG tablet Take by mouth. 02/17/21  Yes [provider]  levonorgestrel (MIRENA) 20 MCG/DAY IUD by Intrauterine route. 09/30/21  Yes [provider]  sertraline (ZOLOFT) 50 MG tablet Take 1 tablet by mouth daily. 08/16/21 11/26/22 Yes [provider]  ibuprofen (ADVIL) 600 MG tablet Take by mouth. 09/13/21   [provider]  norethindrone (AYGESTIN) 5 MG tablet Take 5 mg by mouth daily. 08/23/21   [provider]    Family History Family History  Problem Relation Age of Onset   Hypertension Mother    Diabetes Mother    Atrial fibrillation Mother    Hypertension Father     Social History Social History   Tobacco Use   Smoking status: Never   Smokeless tobacco: Never  Substance Use Topics   Alcohol use: Not Currently   Drug  use: Not Currently     Allergies   Patient has no known allergies.   Review of Systems Review of Systems: :negative unless otherwise stated in HPI.      Physical Exam Triage Vital Signs ED Triage Vitals  Encounter Vitals Group     BP 11/26/22 1127 (!) 127/94     Systolic BP Percentile --      Diastolic BP Percentile --      Pulse Rate 11/26/22 1127 75     Resp 11/26/22 1127 16     Temp 11/26/22 1127 98.1 F (36.7 C)     Temp Source 11/26/22 1127 Oral     SpO2 11/26/22 1127 100 %     Weight 11/26/22 1126 196 lb (88.9 kg)     Height 11/26/22 1126 5\' 5"  (1.651 m)     Head Circumference --      Peak Flow --      Pain Score 11/26/22 1130 0     Pain Loc --      Pain Education --      Exclude from Growth Chart --    No data found.  Updated Vital Signs BP (!) 127/94 (BP Location: Left Arm)   Pulse 75   Temp 98.1 F (36.7 C) (Oral)   Resp 16  Ht 5\' 5"  (1.651 m)   Wt 88.9 kg   SpO2 100%   BMI 32.62 kg/m   Visual Acuity Right Eye Distance:   Left Eye Distance:   Bilateral Distance:    Right Eye Near:   Left Eye Near:    Bilateral Near:     Physical Exam GEN: well appearing female in no acute distress  CVS: well perfused  RESP: speaking in full sentences without pause  ABD: soft, non-tender, non-distended, no palpable masses   Pelvic exam: normal external genitalia, vulva, cervix not visible as there is a flesh toned mass in vaginal canal obscuring my view, lesions absent, no discharge noted, ADNEXA: normal adnexa in size, non-tender, exam chaperoned by CMA and NP student.  IUD strings not visible.  No tampon or foreign bodies appreciated.  Blood in the vaginal vault.   UC Treatments / Results  Labs (all labs ordered are listed, but only abnormal results are displayed) Labs Reviewed - No data to display  EKG   Radiology No results found.  Procedures Procedures (including critical care time)  Medications Ordered in UC Medications - No data to  display  Initial Impression / Assessment and Plan / UC Course  I have reviewed the triage vital signs and the nursing notes.  Pertinent labs & imaging results that were available during my care of the patient were reviewed by me and considered in my medical decision making (see chart for details).      Patient is a 47 y.o.Marland Kitchen female  who presents for retained tampon.  Overall patient is well-appearing and afebrile.  Vital signs stable.  Patient wondering if her IUD is present.  Obtained 1 view abdominal x-ray shows IUD.  X-ray reviewed with patient.  Radiologist impression reviewed and notes IUD in the central pelvis.  Explored vaginal vault but no tampon found.  She continues to feel this pelvic discomfort when she sits down in the chair.  I question whether she could have a partial pelvic prolapse.  Advised patient to follow-up with her gynecologist.  She agrees with this plan.  Return precautions including abdominal pain, fever, chills, nausea, or vomiting given. Discussed MDM, treatment plan and plan for follow-up with patient who agrees with plan.        Final Clinical Impressions(s) / UC Diagnoses   Final diagnoses:  Pelvic pain     Discharge Instructions      Your IUD was seen in the center part of your pelvis on xray. Follow up with your gynecologist to discuss possible urogenital prolapse.  No tampon was found today.      ED Prescriptions   None    PDMP not reviewed this encounter.   Katha Cabal, DO 12/03/22 0024

## 2022-11-26 NOTE — Discharge Instructions (Addendum)
Your IUD was seen in the center part of your pelvis on xray. Follow up with your gynecologist to discuss possible urogenital prolapse.  No tampon was found today.

## 2022-11-27 NOTE — Group Note (Deleted)

## 2023-10-02 ENCOUNTER — Other Ambulatory Visit: Payer: Self-pay

## 2023-10-02 ENCOUNTER — Emergency Department

## 2023-10-02 ENCOUNTER — Emergency Department: Admission: EM | Admit: 2023-10-02 | Discharge: 2023-10-02 | Disposition: A | Discharge status: Home / Self Care

## 2023-10-02 DIAGNOSIS — R0789 Other chest pain: Secondary | ICD-10-CM | POA: Insufficient documentation

## 2023-10-02 DIAGNOSIS — R0602 Shortness of breath: Secondary | ICD-10-CM | POA: Insufficient documentation

## 2023-10-02 DIAGNOSIS — R1013 Epigastric pain: Secondary | ICD-10-CM | POA: Diagnosis not present

## 2023-10-02 DIAGNOSIS — R110 Nausea: Secondary | ICD-10-CM | POA: Insufficient documentation

## 2023-10-02 LAB — HEPATIC FUNCTION PANEL
ALT: 23 U/L (ref 0–44)
AST: 21 U/L (ref 15–41)
Albumin: 4.6 g/dL (ref 3.5–5.0)
Alkaline Phosphatase: 62 U/L (ref 38–126)
Bilirubin, Direct: <0.1 mg/dL (ref 0.0–0.2)
Total Bilirubin: 0.9 mg/dL (ref 0.0–1.2)
Total Protein: 7.9 g/dL (ref 6.5–8.1)

## 2023-10-02 LAB — BASIC METABOLIC PANEL WITH GFR
Anion gap: 9 (ref 5–15)
BUN: 10 mg/dL (ref 6–20)
CO2: 25 mmol/L (ref 22–32)
Calcium: 9.6 mg/dL (ref 8.9–10.3)
Chloride: 103 mmol/L (ref 98–111)
Creatinine, Ser: 0.72 mg/dL (ref 0.44–1.00)
GFR, Estimated: >60 mL/min (ref >60)
Glucose, Bld: 87 mg/dL (ref 70–99)
Potassium: 3.6 mmol/L (ref 3.5–5.1)
Sodium: 137 mmol/L (ref 135–145)

## 2023-10-02 LAB — CBC
HCT: 35.8 % — ABNORMAL LOW (ref 36.0–46.0)
Hemoglobin: 11.4 g/dL — ABNORMAL LOW (ref 12.0–15.0)
MCH: 26.1 pg (ref 26.0–34.0)
MCHC: 31.8 g/dL (ref 30.0–36.0)
MCV: 82.1 fL (ref 80.0–100.0)
Platelets: 263 K/uL (ref 150–400)
RBC: 4.36 MIL/uL (ref 3.87–5.11)
RDW: 14 % (ref 11.5–15.5)
WBC: 5.4 K/uL (ref 4.0–10.5)
nRBC: 0 % (ref 0.0–0.2)

## 2023-10-02 LAB — POC URINE PREG, ED: Preg Test, Ur: NEGATIVE

## 2023-10-02 LAB — TROPONIN I (HIGH SENSITIVITY)
Troponin I (High Sensitivity): 3 ng/L (ref <18)
Troponin I (High Sensitivity): <2 ng/L (ref <18)

## 2023-10-02 LAB — LIPASE, BLOOD: Lipase: 31 U/L (ref 11–51)

## 2023-10-02 MED ORDER — ACETAMINOPHEN 500 MG PO TABS
1000.0000 mg | ORAL_TABLET | Freq: Once | ORAL | Status: AC
  Administered 2023-10-02: 1000 mg via ORAL
  Filled 2023-10-02: qty 2

## 2023-10-02 MED ORDER — LIDOCAINE 5 % EX PTCH
1.0000 | MEDICATED_PATCH | CUTANEOUS | Status: DC
  Administered 2023-10-02: 1 via TRANSDERMAL
  Filled 2023-10-02: qty 1

## 2023-10-02 MED ORDER — LIDOCAINE VISCOUS HCL 2 % MT SOLN
15.0000 mL | Freq: Once | OROMUCOSAL | Status: AC
  Administered 2023-10-02: 15 mL via ORAL
  Filled 2023-10-02: qty 15

## 2023-10-02 MED ORDER — LIDOCAINE 5 % EX PTCH: 1.0000 | MEDICATED_PATCH | CUTANEOUS | 0 refills | Status: AC

## 2023-10-02 MED ORDER — FAMOTIDINE 20 MG PO TABS
20.0000 mg | ORAL_TABLET | Freq: Once | ORAL | Status: AC
  Administered 2023-10-02: 20 mg via ORAL
  Filled 2023-10-02: qty 1

## 2023-10-02 MED ORDER — ALUM & MAG HYDROXIDE-SIMETH 200-200-20 MG/5ML PO SUSP
30.0000 mL | Freq: Once | ORAL | Status: AC
  Administered 2023-10-02: 30 mL via ORAL
  Filled 2023-10-02: qty 30

## 2023-10-02 MED ORDER — FAMOTIDINE 20 MG PO TABS: 20.0000 mg | ORAL_TABLET | Freq: Every day | ORAL | 0 refills | Status: AC

## 2023-10-02 NOTE — Discharge Instructions (Signed)
 Your evaluation in the emergency department was overall reassuring, and I do not think your chest discomfort is due to your heart at this time.  I suspect you likely have a muscle strain and possible acid reflux.  I prescribed you an antacid medication as well as lidocaine  patches, and you can also use Tylenol  as needed for any ongoing discomfort.  Please do follow-up with your primary care provider for reevaluation, and return to the emergency department with any new or worsening symptoms.

## 2023-10-02 NOTE — ED Provider Notes (Signed)
 [facility] Provider Note    Event Date/Time   First MD Initiated Contact with Patient 10/02/23 1849     (approximate)   History   Chest Pain  Pt comes in via pov with complaints of chest pain, SOB, and nausea that started about an hour ago. Pt states that the pain in her chest radiates to her left shoulder. Pt complains of pain 7/10 at this time.    HPI Brenda Savage is a 48 y.o. female PMH anemia, anxiety, right parietal meningioma resents for evaluation of chest pain, nausea - Patient states she was at work today when she developing some chest discomfort around 12 PM.  Localizes to left axillary region and extends toward lower substernal region.  Some mild nausea.  Denies any significant shortness of breath on my eval. -No recent surgery/stasis/travel.  No history of DVT/PE.  No oral hormonal therapy though does have an IUD.  No leg swelling. -No preceding trauma -Does note pain in left chest with ranging of left shoulder -Non-smoker, no family history of MI age less than 89 -History of prior cholecystectomy -Does note that she has had elevated D-dimers in the past though has had negative workups for PE     Physical Exam   Triage Vital Signs: ED Triage Vitals  Encounter Vitals Group     BP 10/02/23 1635 131/89     Girls Systolic BP Percentile --      Girls Diastolic BP Percentile --      Boys Systolic BP Percentile --      Boys Diastolic BP Percentile --      Pulse Rate 10/02/23 1635 73     Resp 10/02/23 1635 19     Temp 10/02/23 1635 98.4 F (36.9 C)     Temp src --      SpO2 10/02/23 1635 100 %     Weight 10/02/23 1636 195 lb 15.8 oz (88.9 kg)     Height 10/02/23 1636 5' 9 (1.753 m)     Head Circumference --      Peak Flow --      Pain Score 10/02/23 1635 7     Pain Loc --      Pain Education --      Exclude from Growth Chart --     Most recent vital signs: Vitals:   10/02/23 1635  BP: 131/89  Pulse: 73  Resp: 19  Temp: 98.4  F (36.9 C)  SpO2: 100%     General: Awake, no distress.  Well-appearing. CV:  Good peripheral perfusion. RRR, RP 2+.  Resp:  Normal effort. CTAB Abd:  No distention. Nontender to deep palpation throughout Other:  Does have notable tenderness to palpation left upper anterior lateral chest and axilla.   ED Results / Procedures / Treatments   Labs (all labs ordered are listed, but only abnormal results are displayed) Labs Reviewed  CBC - Abnormal; Notable for the following components:      Result Value   Hemoglobin 11.4 (*)    HCT 35.8 (*)    All other components within normal limits  BASIC METABOLIC PANEL WITH GFR  LIPASE, BLOOD  HEPATIC FUNCTION PANEL  POC URINE PREG, ED  TROPONIN I (HIGH SENSITIVITY)  TROPONIN I (HIGH SENSITIVITY)     EKG  See ED course below.   RADIOLOGY X-ray interpreted by myself, unremarkable.  Radiology report reviewed.    PROCEDURES:  Critical Care performed: No  Procedures   MEDICATIONS ORDERED  IN ED: Medications  lidocaine  (LIDODERM ) 5 % 1 patch (1 patch Transdermal Patch Applied 10/02/23 2022)  acetaminophen  (TYLENOL ) tablet 1,000 mg (1,000 mg Oral Given 10/02/23 2004)  alum & mag hydroxide-simeth (MAALOX/MYLANTA) 200-200-20 MG/5ML suspension 30 mL (30 mLs Oral Given 10/02/23 2004)    And  lidocaine  (XYLOCAINE ) 2 % viscous mouth solution 15 mL (15 mLs Oral Given 10/02/23 2003)  famotidine  (PEPCID ) tablet 20 mg (20 mg Oral Given 10/02/23 2004)     IMPRESSION / MDM / ASSESSMENT AND PLAN / ED COURSE  I reviewed the triage vital signs and the nursing notes.                              DDX/MDM/AP: Differential diagnosis includes, but is not limited to, likely MSK strain based on exam though consider ACS, considered but doubt D-dimer given no contributing factors or suggestive vital signs here, highly doubt pancreatitis or choledocholithiasis.  Consider dyspepsia.  Plan: - Labs - EKG - Chest x-ray Tylenol , Lidoderm  patch,  Maalox, famotidine  - Reassess  Patient's presentation is most consistent with acute presentation with potential threat to life or bodily function.   ED course below.  Workup unremarkable, serial troponin stable.  Feeling much better after above medications, substernal/epigastric discomfort resolved, left axillary discomfort notably improved.  Overall suspect likely MSK as likely etiology of presentation as well as component of dyspepsia.  Offered referral to cardiology though patient prefers to follow-up with her primary care doctor which I believe is very reasonable, very low clinical concern for cardiac etiology at this time.  ED return precautions in place.  Rx famotidine , Lidoderm  patches, has Tylenol  at home.  Patient agrees with plan.  Clinical Course as of 10/02/23 2121  Tue Oct 02, 2023  1918 Chest x-ray reviewed, unremarkable my interpretation, radiology report below   [MM]  1918 Ecg = sinus rhythm, rate 65, no gross ST elevation or depression, no significant repolarization abnormality, normal axis, normal intervals.  No evidence of ischemia nor arrhythmia on my interpretation. [MM]  1918 CBC with no leukocytosis, stable mild anemia.  BMP unremarkable.  Troponin normal. [MM]  2104 Rpt trop wnl [MM]    Clinical Course User Index [MM] Clarine Ozell LABOR, MD     FINAL CLINICAL IMPRESSION(S) / ED DIAGNOSES   Final diagnoses:  Atypical chest pain     Rx / DC Orders   ED Discharge Orders          Ordered    famotidine  (PEPCID ) 20 MG tablet  Daily        10/02/23 2120    lidocaine  (LIDODERM ) 5 %  Every 24 hours        10/02/23 2120             Note:  This document was prepared using Dragon voice recognition software and may include unintentional dictation errors.   Clarine Ozell LABOR, MD 10/02/23 2121

## 2023-10-02 NOTE — ED Triage Notes (Signed)
 Pt comes in via pov with complaints of chest pain, SOB, and nausea that started about an hour ago. Pt states that the pain in her chest radiates to her left shoulder. Pt complains of pain 7/10 at this time.

## 2024-01-15 NOTE — Progress Notes (Signed)
 "  Department of Rehabilitation Services Physical Therapy Evaluation Visit Date: 01/15/2024 Clinic [facility]H [address] [facility] MEDICINE [facility] CLINIC [address] [place] [address] Dept: [phone] Loc: [phone] Visit Number: 1 for current Episode of Care  SUMMARY ASSESSMENT   The patient is a 48 year old female referred to Physical Therapy by Ronal Rayleen Dublin, NP for PT evaluation and treatment of chronic LBP with L sided sciatica .  Per subjective history and objective assessment, patient presents with LBP, LLE radiating pain, LE weakness, and decreased endurance that is limiting her tolerance to activity, impairing her ability to perform: pushing a cart at work, standing, walking, bending forward, laundry, and household chores.  Patient will benefit from skilled PT in the outpatient setting to address the above impairments in order to decrease pain, increase strength and mobility, and improve tolerance to activity to help return patient to PLOF.    SUBJECTIVE   Gregory ONEIDA Silvan (Preferred name: Anwyn) is a 48 y.o. year old patient referred to Physical Therapy for evaluation of    ICD-10-CM  1. Other low back pain  M54.59  2. Pain in left hip  M25.552  3. Pain in left leg  M79.605  4. Muscle weakness  M62.81   History Related to Current Episode: . Patient presents with LBP that began about 10 years, however within the past 3 years patient has not been as active as she once was.  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  She reports some LLE radiating pain x 3 months that travels all the way down to her great toe (numbness).  She reports numbness at her L great toe and L lateral lower leg.  Patient denies any LLE weakness, however does note offloading her LLE intermittently.  She also reports some balance disturbances, however denies any falls.  Patient denies any sleep disturbances, however does note  some increased tightness in her back in the mornings.  Denies any red flag symptoms.    Symptoms worsen with:  mornings, pushing a cart at work, standing, walking, bending forward, laundry, household chores,   Symptoms improve with:  massage, heat, stretching naproxen Patient goals: I would like to go back to the gym. To decrease the pain, and strengthen my cores.    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 History of Falls in the Past 90 Days: no Falls Risk Factors:  none   Past Medical History   Past Medical History:  Diagnosis Date   Anemia    Atypical squamous cells of undetermined significance (ASCUS) on Papanicolaou smear of cervix 12/30/2013   Negative high risk hpv.  Repeat cotesting 1 year.   Gynecologic infection    remote hx gc/ct, no other hx STIs   History of blood transfusion    due to anemia   History of shortness of breath 12/18/2013   States D-dimers were elevated but V/Q scan was negative.  Stopped oral contraceptives.   Iron deficiency anemia 11/22/2010  Pap smear for cervical cancer screening    no hx abnormal paps   Pica 11/22/2010   Seasonal allergies 11/22/2010   Per MD note on 10/9:  History of Present Illness KYM SCANNELL is a 48 year old female who presents for follow-up on anxiety and recent MRI results.   Anxiety and psychological stress - restarted sertraline only earlier this week after a long period of time off of it.   - History  of anxiety with sporadic use of Xanax ; currently has no Xanax  available. Only used 30 in past year for severe symptoms.  - has a therapist.  Stopped going but plans on restarting now.   - Recent exacerbation of anxiety following a traumatic event involving a nephews suicide, which was discovered by her son - Ongoing stress related to her daughter's crisis episode involving post-traumatic stress and the re-emergence of her daughter's biological father   Low back pain and neurological symptoms - Persistent lower back pain, worsened by sitting - Utilizes a standing desk at work to alleviate symptoms - Numbness present in the left lateral leg and great toes - MRI performed for evaluation of symptoms. Shows mild degenerative changes and very mild disc bulging but no nerve compromise.  - Engages in walking and stretching to manage pain and for exercise.   - Plans to initiate physical therapy at the end of the month  Assessment & Plan  Low Back Pain with Left Leg Numbness Lower back pain with left leg numbness. MRI shows mild degenerative changes, no significant disc bulge or stenosis. Pain likely muscular, numbness may be due to local nerve compression. - Encourage walking and stretching, especially after warming up. - Start physical therapy at the end of the month. - Incorporate strength training exercises such as planks, squats, pushups, sit-ups, and lunges several days a week.  Current Medications   Current Outpatient Medications  Medication Sig Dispense Refill   ALPRAZolam  (XANAX ) 1 MG tablet Take 1 tablet (1 mg total) by mouth once daily as needed for Anxiety 30 tablet 0   ketoconazole (NIZORAL) 2 % shampoo Apply topically once daily Lather, apply to affected areas of skin on the face, let sit for several minutes before rinsing. (Patient not taking: Reported on 12/27/2023) 120 mL 11   levonorgestreL (MIRENA 52 MG) IUD Insert 1 each into the uterus once Follow package directions.      multivitamin tablet Take 1 tablet by mouth once daily (Patient not taking: Reported on 06/14/2023) 30 tablet 11   sertraline (ZOLOFT) 50 MG tablet Take 1 tablet (50 mg total) by mouth once daily 90 tablet 3   XIIDRA 5 % ophthalmic solution Place 1 drop into both eyes 2 (two) times daily     No current facility-administered medications for this visit.    Past Surgical History   Past Surgical History:  Procedure Laterality Date   HYSTEROSCOPY W/ENDOMETRIAL ABLATION USING HTA N/A 09/30/2021   Procedure: HYSTEROSCOPY WITH ENDOMETRIAL ABLATION USING HTA;  Surgeon: Gwyneth Delaine Donzell Nelwyn, MD;  Location: [facility] OR;  Service: Obstetrics;  Laterality: N/A;   INSERTION INTRAUTERINE DEVICE N/A 09/30/2021   Procedure: MIRENA - INSERTION OF INTRAUTERINE DEVICE (IUD);  Surgeon: Gwyneth Delaine Donzell Nelwyn, MD;  Location: [facility] OR;  Service: Obstetrics;  Laterality: N/A;   CHOLECYSTECTOMY     DILATION AND CURETTAGE OF UTERUS      Radiological Studies: See EHR  Outcomes Measures:               *  No data to display           OSPRO-YF: Symptoms, such as pain, are complex and can affect individuals in many ways. The OSPRO-YF questionnaire is intended to explore how symptoms affect each individual to help guide care. Results listed below are based on responses to this questionnaire.    05/30/2021   11:56 AM  OSPRO Yellow Flag Screening  Negative Mood - Depression  No  Negative Mood -  Anxiety No  Negative Mood -  Anger No  Fear Avoidance - Fear Avoidance Beliefs - Physical Activity No  Fear Avoidance - Fear Avoidance Beliefs - Work No  Fear Avoidance - Pain Catastrophizing No  Fear Avoidance - Kinesiophobia No  Fear Avoidance - Pain Anxiety Yes  Positive Affect/Coping - Pain Self-Efficacy Questionnaire No  Positive Affect/Coping - Self-Efficacy for Rehabilitation No  Positive Affect/Coping - Chronic Pain Acceptance Yes  OSPRO Yellow Flag Screening - Total Score 15  Poor  appetite or overeating? Not at all *  Some unimportant thoughts run through my mind and bother me. Sometimes *  I am a hotheaded person. Almost never *  I wouldn't have as much pain as I do if there weren't something potentially dangerous going on in my body. Strongly disagree *  I can't seem to keep the pain out of my mind. To a moderate degree *  I cannot do physical activities that (might) make my pain worse.[Completely Disagree(0)-Completely Agree(6)]  3 *  My work is too heavy for me. [Completely Disagree(0) - Completely Agree(6)] 0 - Completely disagree *  It's OK to experience pain. [Never True(0) - Always True (6)] 1 *  I lead a full life even though I have chronic pain. [Never True(0) - Always True(6)] 5 *  I can perform my therapy no matter how I feel emotionally. [Cannot do it(0) - Can do it(10)] 10 - Certain I can do it *  Total OSPRO Yellow Flags (Out of 11) 2     * Proxy-reported      For PROMIS measures, higher scores equals more of the concept being measured.  PROMIS scores have a mean of 50 and standard deviation (SD) of 10. Scores 0.5 - 1.0 SD worse than the mean = mild symptoms/impairment Scores 1.0 - 2.0 SD worse than the mean = moderate symptoms/impairment Scores 2.0 SD or more worse than the mean = severe symptoms/impairment  Interpretation of PROMIS Scores WNL Mild Symptoms/Impairment Moderate Symptoms/Impairment Severe Symptoms/Impairment  Physical Function (Adult and Pediatric) 45 or greater 40-44 30-39 29 or less  Adult Pain Interference 54 or less 55-59 60-69 70 or greater  Depression 54 or less 55-59 60-69 70 or greater  Sleep Disturbance 54 or less 55-59 60-69 70 or greater  Pediatric or Parent Proxy Pain Interference 50 or less 50-54 55-64 65 or greater  Cognitive Function  45 or greater 40-44  30-39 29 or less    OBJECTIVE   There were no vitals filed for this visit.  Lumbar Spine Examination:  General observation: Pleasant and  cooperative  Cognition:  Alert and oriented x4  Inspection: Gait Normal  Posture Normal  Single leg stance Normal  Squat Abnormal: quad heavy, heels lifted off ground, some increased pain in low back    Range of Motion: Thoraco-Lumbar Single Movement Testing  Flexion  Limited ROM and not painful  Extension Normal ROM and not painful  Lateral bending Right Normal ROM and not painful  Lateral bending Left Normal ROM and not  painful  Rotation right  Normal and non painful   Rotation left  Normal and painful    Thoraco-Lumbar Repeated Movement Testing  Flexion  Not tested  Extension Centralization   Neural Evaluation: Thoraco-Lumbar   SLR Not Tested  Lumbar quadrant  R/L (-) , slight burning sensation in low back with L quadrant, but no radiating pain   Slump Test Negative   Motor:  Thoraco-Lumbar Right  Left  Hip Flexion (L2-3) 4+/5 4+/5  Hip Abduction (L5) 4/5 4/5  Hip Extension (L5-S1) 4/5 4/5  Knee Extension (L3-4) 5/5 5/5  Knee Flexion (L5-S1) 4+/5 4+/5  Dorsiflexion (L5) 5/5 5/5   Sensory:  Thoraco-Lumbar  Right Left  L2 (Anteromedial thigh) Normal Normal  L3 (Medial epicondyle of femur) Normal Normal  L4 (Medial malleolus) Normal Normal  L5 (Dorsal foot at 3rd MTP joint) Normal Normal  S1 (Lateral aspect of calcaneus) Normal Diminished  S2 (Midpoint of popliteal fossa) Normal Diminished     Palpation: assess in upcoming visits   Hip: assess in upcoming visits   Today's Treatment Included: Instruct in/perform Home Exercise program consisting of:  Therapeutic Activity: (Dynamic functional activities were selected to decrease fear and improve functional performance)    Education: PT educated pt on PT POC, PT dx, goals of PT, importance of preventing uncontrolled pain during there-ex (but noting that some discomfort is to be expected), the balance of muscles surround the lumbar spine and hips for postural/ergonomic control, and HEP Education on ROM, strength,  gait, balance, and functional mobility deficits. Created and performed HEP below to ensure appropriate form and understanding. Issued handout with written verbal cueing to improve form. Answered all pt questions. Bridges  LTRs    Frequency, sets, and reps were detailed on the patient's handout and/or video   Patient/Family education: The patient was educated regarding examination findings.  The patient received education regarding home exercises through demonstration/handout/verbal cues, appeared to show willingness to learn and Verbalizes understanding.  The department attendance guidelines were reviewed with the patient during the visit.   ASSESSMENT   In summary, Inessa presents to PT today with Low back pain since  Date of Onset: 01/14/14. Primary impairments include Difficulty walking, Limited strength, Limited Range of Motion, Limited muscular endurance, Impaired gait, and Pain. The patient will benefit from skilled PT intervention to address the above stated impairments and assist the patient in maximizing their functional level.  The patient's condition, Plan of Care and Outcome for this episode of care may be negatively impacted by PSH/PMH, Relevant Personal Factors and Co-morbidities that include, but may not be limited to: chronicity pain/symptoms , mental health history, psychosocial history , multiple pain sites, low activity tolerance, and medical history.  Alexsandra's clinical presentation today is Evolving with changing characteristics in nature, and the complexity of clinical decision making required to determine the plan of care today was of moderate complexity.   Goals: Patient will decrease average pain level to no more than 1-2/10 at least 75% of the time in order to increase tolerance to activity.    The patient will demonstrate improved strength of core, gluteals and hip abductors of greater or equal to 5/5 for functional control during dynamic activities and transfers    The patient will demonstrate improved ROM as displayed by painfree ROM within functional limits to allow for normalized gait, bathing, and transfers.  Patient will report being able to perform all household chores without a rest break and without any increased symptoms, demonstrating an improvement in  her tolerance to activity.  The patient will demonstrate improved function as measured by an increase in PROMIS-CAT Physical Function score by > or = to 5 points and engagement in activity as measured by a decrease in PROMIS-CAT Pain Interference score by > or = to 5 points. The patient will be independent with HEP.  Time Frame: 12 weeks   Rehab Potential - Good   PLAN   The plan of care was made with the patient and/or family/caregiver.   Recommended Follow up: Follow up with regular skilled intervention as recommended below Frequency: 1x this week, then 1x every other week  Duration: 90 days/3 months, with expected extension after assessment of progress at that time Planned Potential Interventions include:  Aquatic Therapy Civil Engineer, Contracting Education in Diagnosis and Self Management Education in Risk Reduction Practices Functional Progression Joint Mobilization Neuromuscular Training Passive ROM/Stretching Patient/Family/Caregiver Education Postural Training Therapeutic Exercises Soft Tissue Mobilization  Stretches/ROM Exercises Strengthening Modalities to potentially include dry needling, instrumented assisted manual techniques  Plan for next visit: Continued progression of home exercise program and manual therapy intervention as indicated.  Billing Information: Visit Date: 01/15/2024 PT Evaluation or progress note completed today: Yes Date of Onset: 01/14/14 Visit Number: 1 Session Start:: 1440 Session Stop:: 1518 Total Time: 38 minutes   PT Eval MODERATE Complexity CPT 97162: 1    Therapeutic Activity CPT 97530: 12 minutes                    Note: Per the  evaluating physical therapist's plan of care, if patient does not return for follow up visit(s) related to this episode of care, this note will serve as their discharge note from physical therapy.  If patient returns to clinic with variance in plan of care, then it may be attributable to one or more of the following factors: preferred clinician availability, appointment time request availability, therapy pool appointment availability, major holiday with clinic closure, care partner availability, patient transportation, conflicting medical appointment, inclement weather, patient illness, and/or scheduling error.  ___________________________________________________                                 Clinical Notes on MyChart: Progress notes documented by your healthcare team will now be available on the MyChart portal.  We believe that patients should be a part of the healthcare team.  We encourage you to review notes after visits and in preparation for upcoming appointments.  This provides the opportunity to review recommendations as well as to prepare questions for your healthcare team to address during your next visit.  If you identify discrepancies in the documentation or have specific questions related to the notes, please bring them to your next scheduled visit to discuss with your Physical Therapist (PT).  With increased transparency, our hope is that we create more trust, better communication, more shared decision-making, and increased satisfaction. Please be aware that these notes will not be discussed over the phone or through My Chart messages.  They will be discussed only at your next office visit with your provider.  *Some images could not be shown."

## 2024-03-23 ENCOUNTER — Emergency Department

## 2024-03-23 ENCOUNTER — Emergency Department
Admission: EM | Admit: 2024-03-23 | Discharge: 2024-03-23 | Disposition: A | Discharge status: Home / Self Care | Attending: Emergency Medicine | Admitting: Emergency Medicine

## 2024-03-23 DIAGNOSIS — R079 Chest pain, unspecified: Secondary | ICD-10-CM | POA: Diagnosis present

## 2024-03-23 DIAGNOSIS — R0789 Other chest pain: Secondary | ICD-10-CM

## 2024-03-23 DIAGNOSIS — K219 Gastro-esophageal reflux disease without esophagitis: Secondary | ICD-10-CM | POA: Diagnosis not present

## 2024-03-23 LAB — BASIC METABOLIC PANEL WITH GFR
Anion gap: 10 (ref 5–15)
BUN: 8 mg/dL (ref 6–20)
CO2: 25 mmol/L (ref 22–32)
Calcium: 9.5 mg/dL (ref 8.9–10.3)
Chloride: 103 mmol/L (ref 98–111)
Creatinine, Ser: 0.67 mg/dL (ref 0.44–1.00)
GFR, Estimated: >60 mL/min
Glucose, Bld: 115 mg/dL — ABNORMAL HIGH (ref 70–99)
Potassium: 4.2 mmol/L (ref 3.5–5.1)
Sodium: 138 mmol/L (ref 135–145)

## 2024-03-23 LAB — CBC
HCT: 37.1 % (ref 36.0–46.0)
Hemoglobin: 12 g/dL (ref 12.0–15.0)
MCH: 26.5 pg (ref 26.0–34.0)
MCHC: 32.3 g/dL (ref 30.0–36.0)
MCV: 82.1 fL (ref 80.0–100.0)
Platelets: 279 K/uL (ref 150–400)
RBC: 4.52 MIL/uL (ref 3.87–5.11)
RDW: 14.6 % (ref 11.5–15.5)
WBC: 5.9 K/uL (ref 4.0–10.5)
nRBC: 0 % (ref 0.0–0.2)

## 2024-03-23 LAB — D-DIMER, QUANTITATIVE: D-Dimer, Quant: <0.27 ug{FEU}/mL (ref 0.00–0.50)

## 2024-03-23 LAB — TROPONIN T, HIGH SENSITIVITY: Troponin T High Sensitivity: <15 ng/L (ref 0–19)

## 2024-03-23 MED ORDER — FAMOTIDINE 20 MG PO TABS
20.0000 mg | ORAL_TABLET | Freq: Once | ORAL | Status: AC
  Administered 2024-03-23: 20 mg via ORAL
  Filled 2024-03-23: qty 1

## 2024-03-23 MED ORDER — ALUM & MAG HYDROXIDE-SIMETH 200-200-20 MG/5ML PO SUSP
15.0000 mL | Freq: Once | ORAL | Status: AC
  Administered 2024-03-23: 15 mL via ORAL
  Filled 2024-03-23: qty 30

## 2024-03-23 NOTE — Discharge Instructions (Signed)
 I recommend that you take Pepcid  (famotidine ) 20 mg twice daily for the next 1 to 2 weeks.  Avoid spicy or acidic foods.  Follow-up with your primary care provider.  Return to the ER for new, worsening, or persistent severe chest pain, difficulty breathing, weakness or lightheadedness, or any other new or worsening symptoms that concern you.

## 2024-03-23 NOTE — ED Provider Notes (Signed)
 "  [facility] Provider Note    Event Date/Time   First MD Initiated Contact with Patient 03/23/24 1237     (approximate)   History   Chest Pain   HPI  Brenda Savage is a 49 y.o. female with a history of anemia, anxiety, and meningioma who presents with chest pain for the last 4 days, intermittent, worse after she eats, and mainly in the left upper chest.  It is dull pain.  Sometimes it moves more into the center of her chest.  She states that eating seems to exacerbate it.  She feels like some of the pain is deep inside her throat.  She does not have a sore throat but feels that it is lower down.  She reports some intermittent shortness of breath.  She does not feel dizzy or lightheaded.  The pain is not related to exertion.  She states that she took Mylanta with no relief.  She has also tried to burp to see if she can pass gas, and this has helped somewhat.  She states that the pain is a bit different than when she was here for several months ago.  The patient denies any leg pain or swelling.  She has no cough or fever.  Reviewed the past medical records.  The patient was seen in the ED on 715 of this year for chest pain.  Her workup was negative at that time.  Her most recent outpatient encounter was with physical therapy on 10/28 for treatment of lower back pain with sciatica.   Physical Exam   Triage Vital Signs: ED Triage Vitals  Encounter Vitals Group     BP 03/23/24 1214 (!) 140/93     Girls Systolic BP Percentile --      Girls Diastolic BP Percentile --      Boys Systolic BP Percentile --      Boys Diastolic BP Percentile --      Pulse Rate 03/23/24 1214 83     Resp 03/23/24 1214 18     Temp 03/23/24 1214 98.4 F (36.9 C)     Temp Source 03/23/24 1214 Oral     SpO2 03/23/24 1214 97 %     Weight 03/23/24 1211 201 lb (91.2 kg)     Height 03/23/24 1211 5' 9 (1.753 m)     Head Circumference --      Peak Flow --      Pain Score 03/23/24 1210 7      Pain Loc --      Pain Education --      Exclude from Growth Chart --     Most recent vital signs: Vitals:   03/23/24 1214 03/23/24 1243  BP: (!) 140/93   Pulse: 83   Resp: 18   Temp: 98.4 F (36.9 C)   SpO2: 97% 97%     General: Alert, well-appearing, no distress.  CV:  Good peripheral perfusion.  Normal heart sounds. Resp:  Normal effort.  Lungs CTAB. Abd:  No distention.  Other:  No peripheral edema.   ED Results / Procedures / Treatments   Labs (all labs ordered are listed, but only abnormal results are displayed) Labs Reviewed  BASIC METABOLIC PANEL WITH GFR - Abnormal; Notable for the following components:      Result Value   Glucose, Bld 115 (*)    All other components within normal limits  CBC  D-DIMER, QUANTITATIVE  POC URINE PREG, ED  TROPONIN T, HIGH SENSITIVITY  EKG  ED ECG REPORT I, Waylon Cassis, the attending physician, personally viewed and interpreted this ECG.  Date: 03/23/2024 EKG Time: 1211 Rate: 95 Rhythm: normal sinus rhythm QRS Axis: Right axis Intervals: normal ST/T Wave abnormalities: normal Narrative Interpretation: no evidence of acute ischemia    RADIOLOGY  Chest x-ray: I independently viewed and interpreted the images; there is no focal consolidation or edema  PROCEDURES:  Critical Care performed: No  Procedures   MEDICATIONS ORDERED IN ED: Medications  famotidine  (PEPCID ) tablet 20 mg (20 mg Oral Given 03/23/24 1331)  alum & mag hydroxide-simeth (MAALOX/MYLANTA) 200-200-20 MG/5ML suspension 15 mL (15 mLs Oral Given 03/23/24 1331)     IMPRESSION / MDM / ASSESSMENT AND PLAN / ED COURSE  I reviewed the triage vital signs and the nursing notes.  49 year old female with PMH as noted above presents with some atypical, nonexertional chest pain over the last several days associated with intermittent shortness of breath and also exacerbated by eating.  On exam her vital signs are normal except for borderline  hypertension.  Physical exam is unremarkable for acute findings.  EKG is nonischemic.  Chest x-ray is clear.  Differential diagnosis includes, but is not limited to, GERD, musculoskeletal pain, less likely ACS.  I have a low suspicion for PE.  However, given the persistent pain for several days, the report of shortness of breath, and also a borderline S1/Q3/T3 type pattern on her EKG, we will obtain a D-dimer.  Given the duration of the symptoms there is no indication for serial cardiac enzymes.  Initial troponin is negative.  Patient's presentation is most consistent with acute complicated illness / injury requiring diagnostic workup.  ----------------------------------------- 2:43 PM on 03/23/2024 -----------------------------------------  D-dimer is negative.  On reassessment the patient states she feels a bit better after the Pepcid  and Maalox.  Overall presentation is most consistent with GERD.  At this time the patient is stable for discharge.  I counseled her on the results of the workup and answered all of her questions.  I gave strict return precautions, and she expressed understanding.   FINAL CLINICAL IMPRESSION(S) / ED DIAGNOSES   Final diagnoses:  Atypical chest pain  Gastroesophageal reflux disease, unspecified whether esophagitis present     Rx / DC Orders   ED Discharge Orders     None        Note:  This document was prepared using Dragon voice recognition software and may include unintentional dictation errors.    Cassis Waylon, MD 03/23/24 1444  "

## 2024-03-23 NOTE — ED Triage Notes (Signed)
 Pt presents to the ED via POV from home with left/central CP since Wednesday. Pt describes the pain as a heaviness in her chest. Pt reports some accompanied SHOB intermittently. Pt A&Ox4.
